# Patient Record
Sex: Male | Born: 1970 | Race: White | Hispanic: No | Smoking: Never smoker
Health system: Southern US, Community
[De-identification: ages and names within clinical notes are randomized; demographics above are authoritative.]

## PROBLEM LIST (undated history)

## (undated) DIAGNOSIS — F1910 Other psychoactive substance abuse, uncomplicated: Secondary | ICD-10-CM

## (undated) DIAGNOSIS — F1420 Cocaine dependence, uncomplicated: Secondary | ICD-10-CM

## (undated) DIAGNOSIS — I4891 Unspecified atrial fibrillation: Secondary | ICD-10-CM

## (undated) HISTORY — DX: Other psychoactive substance abuse, uncomplicated: F19.10

## (undated) HISTORY — DX: Unspecified atrial fibrillation: I48.91

---

## 2003-02-04 ENCOUNTER — Emergency Department (HOSPITAL_COMMUNITY): Admission: EM | Admit: 2003-02-04 | Discharge: 2003-02-04 | Payer: Self-pay | Admitting: Emergency Medicine

## 2003-02-25 ENCOUNTER — Ambulatory Visit (HOSPITAL_COMMUNITY): Admission: RE | Admit: 2003-02-25 | Discharge: 2003-02-25 | Payer: Self-pay | Admitting: *Deleted

## 2004-11-18 ENCOUNTER — Emergency Department (HOSPITAL_COMMUNITY): Admission: EM | Admit: 2004-11-18 | Discharge: 2004-11-18 | Payer: Self-pay | Admitting: Emergency Medicine

## 2008-06-18 ENCOUNTER — Emergency Department (HOSPITAL_COMMUNITY): Admission: EM | Admit: 2008-06-18 | Discharge: 2008-06-18 | Payer: Self-pay

## 2010-07-21 NOTE — Cardiovascular Report (Signed)
NAMEAYLAN, BAYONA NO.:  [id number]   MEDICAL RECORD NO.:  [id number]                   PATIENT TYPE:  OIB   LOCATION:  2861                                 FACILITY:  [facility]   PHYSICIAN:  Carole Binning, M.D. [facility]         DATE OF BIRTH:  Jul 07, 1970   DATE OF PROCEDURE:  02/25/2003  DATE OF DISCHARGE:  02/25/2003                              CARDIAC CATHETERIZATION   PROCEDURES PERFORMED:  1. Left heart catheterization with,  2. Coronary angiography; and,  3. Left ventriculography.   CARDIOLOGIST:  Carole Binning, M.D.   INDICATIONS:  Mr. Dagostino is a 40 year old male who presented with chest  pain and left arm numbness.  A stress Cardiolite performed in the office was  interpreted as revealing inferior ischemia with an ejection fraction of 40%.  The patient was therefore referred for cardiac catheterization to rule out  coronary artery disease.   PROCEDURAL NOTE:  A 6 French sheath was placed  the  right femoral artery.  Left coronary angiography was performed with a 6 French JL-4 catheter.  Right coronary angiography was performed initially with a 6 [place] JR-4  catheter and then we switched out for a No-Torque right catheter.  Eft  ventriculography was performed with an angled pigtail catheter.   Contrast was Omnipaque.   COMPLICATIONS:  There were no complications.   RESULTS:   HEMODYNAMIC DATA:  Left ventricular pressure 102/9.  Aortic pressure 100/64.  There is no aortic valve gradient.   VENTRICULOGRAPHIC DATA:  Left Ventriculogram:  Wall motion is normal.  Ejection fraction calculated at 64%.  There is no mitral regurgitation.   ANGIOGRAPHIC DATA:  Coronary Arteriography (Right Dominant)  Left Main:  The left main is normal.   Left Anterior Descending:  The left anterior descending artery gives rise to  a small first diagonal and a normal-size second and third diagonal branches.  The LAD is normal.   Circumflex Artery:   The left circumflex gives rise to a single large obtuse  marginal branch.  The left circumflex is normal.   Right Coronary Artery:  The right coronary artery is a dominant vessel.  It  gives rise to a normal size posterior descending artery, a small first  posterolateral branch and normal-size second posterolateral branch.   IMPRESSION:  1. Normal left ventricular systolic function.  2. No evidence of coronary artery disease.   CONCLUSION:  In conclusion the patient's Cardiolite scan appears to be a  false positive.  This was a normal study.                                               Carole Binning, M.D. [facility]    MWP/MEDQ  D:  02/25/2003  T:  02/25/2003  Job:  [id number]   cc:  Learta Codding, M.D.

## 2011-01-21 ENCOUNTER — Emergency Department (HOSPITAL_COMMUNITY)
Admission: EM | Admit: 2011-01-21 | Discharge: 2011-01-22 | Disposition: A | Discharge status: Home / Self Care | Payer: Worker's Compensation | Attending: Emergency Medicine | Admitting: Emergency Medicine

## 2011-01-21 DIAGNOSIS — M5380 Other specified dorsopathies, site unspecified: Secondary | ICD-10-CM | POA: Insufficient documentation

## 2011-01-21 DIAGNOSIS — M549 Dorsalgia, unspecified: Secondary | ICD-10-CM | POA: Insufficient documentation

## 2011-01-21 DIAGNOSIS — X500XXA Overexertion from strenuous movement or load, initial encounter: Secondary | ICD-10-CM | POA: Insufficient documentation

## 2011-01-21 DIAGNOSIS — M62830 Muscle spasm of back: Secondary | ICD-10-CM

## 2011-01-21 DIAGNOSIS — Y9269 Other specified industrial and construction area as the place of occurrence of the external cause: Secondary | ICD-10-CM | POA: Insufficient documentation

## 2011-01-21 DIAGNOSIS — M25519 Pain in unspecified shoulder: Secondary | ICD-10-CM | POA: Insufficient documentation

## 2011-01-21 DIAGNOSIS — Y990 Civilian activity done for income or pay: Secondary | ICD-10-CM | POA: Insufficient documentation

## 2011-01-21 HISTORY — DX: Cocaine dependence, uncomplicated: F14.20

## 2011-01-21 LAB — RAPID URINE DRUG SCREEN, HOSP PERFORMED
Amphetamines: NOT DETECTED
Barbiturates: NOT DETECTED
Benzodiazepines: NOT DETECTED
Cocaine: NOT DETECTED
Opiates: NOT DETECTED
Tetrahydrocannabinol: NOT DETECTED

## 2011-01-21 MED ORDER — SODIUM CHLORIDE 0.9 % IV BOLUS (SEPSIS)
250.0000 mL | Freq: Once | INTRAVENOUS | Status: AC
  Administered 2011-01-21: 250 mL via INTRAVENOUS

## 2011-01-21 MED ORDER — KETOROLAC TROMETHAMINE 30 MG/ML IJ SOLN
30.0000 mg | Freq: Once | INTRAMUSCULAR | Status: AC
  Administered 2011-01-21: 30 mg via INTRAVENOUS
  Filled 2011-01-21: qty 1

## 2011-01-21 MED ORDER — DIAZEPAM 5 MG/ML IJ SOLN
5.0000 mg | Freq: Once | INTRAMUSCULAR | Status: AC
Start: 2011-01-21 — End: 2011-01-21
  Administered 2011-01-21: 5 mg via INTRAVENOUS
  Filled 2011-01-21: qty 2

## 2011-01-21 NOTE — ED Notes (Signed)
ZOX:[id number]<AV> Expected date:01/21/11<BR> Expected time: 8:04 PM<BR> Means of arrival:Ambulance<BR> Comments:<BR> [id number] Back pain, injury at work.

## 2011-01-21 NOTE — ED Provider Notes (Signed)
Medical screening examination/treatment/procedure(s) were performed by non-physician practitioner and as supervising physician I was immediately available for consultation/collaboration.   Benny Lennert, MD 01/21/11 2220

## 2011-01-21 NOTE — ED Notes (Signed)
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

## 2011-01-21 NOTE — ED Provider Notes (Addendum)
History     CSN: [id number] Arrival date & time: 01/21/2011  8:23 PM   First MD Initiated Contact with Patient 01/21/11 2100      Chief Complaint  Patient presents with  . 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   Patient is a 41 y.o. male presenting with back pain. The history is provided by the patient.  Back Pain  This is a new problem. The current episode started 6 to 12 hours ago. The problem occurs constantly. The problem has been gradually worsening. The pain is associated with lifting heavy objects. The pain is present in the lumbar spine. The quality of the pain is described as cramping. The pain does not radiate. The pain is at a severity of 10/10. The pain is severe. The symptoms are aggravated by bending, twisting and certain positions. Pertinent negatives include no numbness, no abdominal pain, no bowel incontinence, no perianal numbness, no bladder incontinence, no paresthesias and no weakness. He has tried nothing for the symptoms.    Past Medical History  Diagnosis Date  . Cocaine addiction     History reviewed. No pertinent past surgical history.  No family history on file.  History  Substance Use Topics  . Smoking status: Not on file  . Smokeless tobacco: Not on file  . Alcohol Use:       Review of Systems  Constitutional: Positive for activity change. Negative for chills.  HENT: Negative.   Eyes: Negative.   Respiratory: Negative.   Cardiovascular: Negative.   Gastrointestinal: Negative.  Negative for abdominal pain and bowel incontinence.  Genitourinary: Negative.  Negative for  bladder incontinence.  Musculoskeletal: Positive for back pain.  Neurological: Negative for weakness, numbness and paresthesias.  Hematological: Negative.   Psychiatric/Behavioral: Negative.     Allergies  Review of patient's allergies indicates no known allergies.  Home Medications   Current Outpatient Rx  Name Route Sig Dispense Refill  . IBUPROFEN 200 MG PO TABS Oral Take 800 mg by mouth every 6 (six) hours as needed. Pain     . DIAZEPAM 5 MG PO TABS Oral Take 1 tablet (5 mg total) by mouth 2 (two) times daily. 10 tablet 0  . NAPROXEN 500 MG PO TABS Oral Take 1 tablet (500 mg total) by mouth 2 (two) times daily. 30 tablet 0    BP 114/60  Pulse 60  Temp(Src) 98.2 F (36.8 C) (Oral)  Resp 16  SpO2 100%  Physical Exam  Constitutional: He appears well-developed and well-nourished. No distress.  HENT:  Head: Atraumatic.  Eyes: EOM are normal.  Neck: Neck supple.  Cardiovascular: Regular rhythm.   Pulmonary/Chest: Breath sounds normal.  Abdominal: Soft.  Musculoskeletal:       Right shoulder: He exhibits tenderness and pain.       Arms:   ED Course  Procedures (including critical care time)   Labs Reviewed  URINE RAPID DRUG SCREEN (HOSP PERFORMED)   No results found. 1:06 AM patient states he does not feel ready to go home yet will give additional Valium and reassess in 1 hour   1. Back muscle spasm  MDM  Muscle spasm verses slipped disc due to the history most likely muscle spasm will treat as such and recommend follow up with ortho if not improving         Arman Filter, NP 01/21/11 2122  Arman Filter, NP 01/22/11 0020  Arman Filter, NP 01/22/11 0107

## 2011-01-22 MED ORDER — DIAZEPAM 5 MG/ML IJ SOLN
5.0000 mg | Freq: Once | INTRAMUSCULAR | Status: AC
  Administered 2011-01-22: 5 mg via INTRAVENOUS
  Filled 2011-01-22: qty 2

## 2011-01-22 MED ORDER — DIAZEPAM 5 MG PO TABS: 5.0000 mg | ORAL_TABLET | Freq: Two times a day (BID) | ORAL | Status: AC

## 2011-01-22 MED ORDER — NAPROXEN 500 MG PO TABS: 500.0000 mg | ORAL_TABLET | Freq: Two times a day (BID) | ORAL | Status: AC

## 2011-01-22 NOTE — ED Notes (Addendum)
Pt was unable to ambulate without assistance. Pt was unable to stand up straight, however he was able to ambulate bent slightly forward. Pt complained of pain at 2/10. After arriving to room pt began to complain of "back locking up". Pt was unable to stand without assistance. Pt was helped to bed. NP Kathrine Cords Made aware.

## 2011-01-22 NOTE — ED Provider Notes (Signed)
Medical screening examination/treatment/procedure(s) were performed by non-physician practitioner and as supervising physician I was immediately available for consultation/collaboration.   Benny Lennert, MD 01/22/11 1539

## 2012-08-21 ENCOUNTER — Emergency Department (HOSPITAL_COMMUNITY)
Admission: EM | Admit: 2012-08-21 | Discharge: 2012-08-21 | Disposition: A | Discharge status: Home / Self Care | Payer: 59 | Source: Home / Self Care | Attending: Family Medicine | Admitting: Family Medicine

## 2012-08-21 ENCOUNTER — Encounter (HOSPITAL_COMMUNITY): Payer: Self-pay | Admitting: Emergency Medicine

## 2012-08-21 DIAGNOSIS — J40 Bronchitis, not specified as acute or chronic: Secondary | ICD-10-CM

## 2012-08-21 DIAGNOSIS — J040 Acute laryngitis: Secondary | ICD-10-CM

## 2012-08-21 LAB — POCT RAPID STREP A: Streptococcus, Group A Screen (Direct): NEGATIVE

## 2012-08-21 MED ORDER — GUAIFENESIN-CODEINE 100-10 MG/5ML PO SYRP: 5.0000 mL | ORAL_SOLUTION | Freq: Three times a day (TID) | ORAL | Status: DC | PRN

## 2012-08-21 MED ORDER — AZITHROMYCIN 250 MG PO TABS: ORAL_TABLET | ORAL | Status: DC

## 2012-08-21 MED ORDER — BENZONATATE 100 MG PO CAPS: 100.0000 mg | ORAL_CAPSULE | Freq: Three times a day (TID) | ORAL | Status: DC

## 2012-08-21 MED ORDER — PREDNISONE 20 MG PO TABS: ORAL_TABLET | ORAL | Status: DC

## 2012-08-21 MED ORDER — CETIRIZINE-PSEUDOEPHEDRINE ER 5-120 MG PO TB12
1.0000 | ORAL_TABLET | Freq: Two times a day (BID) | ORAL | Status: DC | PRN
Start: 2012-08-21 — End: 2013-12-30

## 2012-08-21 MED ORDER — IBUPROFEN 600 MG PO TABS: 600.0000 mg | ORAL_TABLET | Freq: Three times a day (TID) | ORAL | Status: DC | PRN

## 2012-08-21 MED ORDER — ALBUTEROL SULFATE HFA 108 (90 BASE) MCG/ACT IN AERS: 1.0000 | INHALATION_SPRAY | Freq: Four times a day (QID) | RESPIRATORY_TRACT | Status: DC | PRN

## 2012-08-21 NOTE — ED Notes (Signed)
Pt c/o sore throat x 3-4 weeks. Has lots of chest congestion and has been coughing up mucous. Has been taking Mucinex with mild relief. Pt is losing his voice. Feels like he is progressively getting worse. Patient is alert and oriented.

## 2012-08-21 NOTE — ED Provider Notes (Signed)
History     CSN: [id number]  Arrival date & time 08/21/12  1341   First MD Initiated Contact with Patient 08/21/12 1411      Chief Complaint  Patient presents with  . Sore Throat    (Consider location/radiation/quality/duration/timing/severity/associated sxs/prior treatment) HPI Comments: 42 y/o nonsmoker male. Here complaining of sore throat, nasal congestion, cough and episodes of persistent frequent coughing and intermittent wheezing for about 3 weeks. Denies fever. Reports yellow sputum. Appetite is good. Energy level is good. Has been taking Mucinex with some improvement of his symptoms. In the last 2 days patient presents with hoarseness associated symptoms. Feels his symptoms are getting worse. Denies current pain with deep inspiration. Patient works as a Interior and spatial designer.   Past Medical History  Diagnosis Date  . Cocaine addiction     History reviewed. No pertinent past surgical history.  History reviewed. No pertinent family history.  History  Substance Use Topics  . Smoking status: Never Smoker   . Smokeless tobacco: Not on file  . Alcohol Use: No      Review of Systems  Constitutional: Negative for fever and chills.  HENT: Positive for congestion, sore throat, rhinorrhea and voice change.   Respiratory: Positive for cough and wheezing. Negative for shortness of breath.   Cardiovascular: Negative for chest pain, palpitations and leg swelling.  Gastrointestinal: Negative for nausea, vomiting and abdominal pain.  Musculoskeletal: Negative for myalgias and arthralgias.  Neurological: Negative for dizziness and headaches.  All other systems reviewed and are negative.    Allergies  Review of patient's allergies indicates no known allergies.  Home Medications   Current Outpatient Rx  Name  Route  Sig  Dispense  Refill  . albuterol (PROVENTIL HFA;VENTOLIN HFA) 108 (90 BASE) MCG/ACT inhaler   Inhalation   Inhale 1-2 puffs into the lungs every 6 (six) hours as  needed for wheezing.   1 Inhaler   0   . azithromycin (ZITHROMAX) 250 MG tablet      2 tabs by mouth on day one then one tablet daily for 4 more days.   6 tablet   0   . benzonatate (TESSALON) 100 MG capsule   Oral   Take 1 capsule (100 mg total) by mouth every 8 (eight) hours.   21 capsule   0   . cetirizine-pseudoephedrine (ZYRTEC-D) 5-120 MG per tablet   Oral   Take 1 tablet by mouth 2 (two) times daily as needed for allergies or rhinitis.   30 tablet   0   . guaiFENesin-codeine (ROBITUSSIN AC) 100-10 MG/5ML syrup   Oral   Take 5 mLs by mouth 3 (three) times daily as needed for cough.   120 mL   0   . ibuprofen (ADVIL,MOTRIN) 600 MG tablet   Oral   Take 1 tablet (600 mg total) by mouth every 8 (eight) hours as needed for pain or fever (take with food).   21 tablet   0   . predniSONE (DELTASONE) 20 MG tablet      2 tabs po daily for 5 days   10 tablet   0     BP 122/74  Pulse 67  Temp(Src) 98.4 F (36.9 C) (Oral)  Resp 18  SpO2 99%  Physical Exam  Nursing note and vitals reviewed. Constitutional: He is oriented to person, place, and time. He appears well-developed and well-nourished. No distress.  HENT:  Head: Normocephalic and atraumatic.  Nasal Congestion with erythema and swelling of nasal turbinates, clear  rhinorrhea. Hoarse voice. Pharyngeal erythema no exudates. No uvula deviation. No trismus. TM's with increased vascular markings but no dullness, swelling or bulging.  Eyes: Conjunctivae are normal. Right eye exhibits no discharge. Left eye exhibits no discharge. No scleral icterus.  Cardiovascular: Normal rate, regular rhythm and normal heart sounds.   Pulmonary/Chest: Effort normal and breath sounds normal. No respiratory distress. He has no wheezes. He has no rales. He exhibits no tenderness.  Bronchitic cough.  Lymphadenopathy:    He has no cervical adenopathy.  Neurological: He is alert and oriented to person, place, and time.  Skin: No  rash noted. He is not diaphoretic.    ED Course  Procedures (including critical care time)  Labs Reviewed  POCT RAPID STREP A ([facility]RG CARE ONLY)   No results found.   1. Bronchitis   2. Laryngitis       MDM  Treated with ibuprofen, prednisone, albuterol, azithromycin, Tessalon Perles, cetirizine/pseudoephedrine and guaifenesin/codeine. Supportive care and red flags that should prompt his return to medical attention discussed with patient and provided in writing.        Sharin Grave, MD 08/22/12 [phone]

## 2012-08-23 LAB — CULTURE, GROUP A STREP

## 2013-06-30 ENCOUNTER — Encounter (HOSPITAL_COMMUNITY): Payer: Self-pay | Admitting: Emergency Medicine

## 2013-06-30 ENCOUNTER — Emergency Department (HOSPITAL_COMMUNITY)
Admission: EM | Admit: 2013-06-30 | Discharge: 2013-06-30 | Disposition: A | Discharge status: Home / Self Care | Payer: 59 | Source: Home / Self Care

## 2013-06-30 DIAGNOSIS — S01501A Unspecified open wound of lip, initial encounter: Secondary | ICD-10-CM

## 2013-06-30 DIAGNOSIS — S01511A Laceration without foreign body of lip, initial encounter: Secondary | ICD-10-CM

## 2013-06-30 DIAGNOSIS — S01551A Open bite of lip, initial encounter: Secondary | ICD-10-CM

## 2013-06-30 DIAGNOSIS — W540XXA Bitten by dog, initial encounter: Secondary | ICD-10-CM

## 2013-06-30 MED ORDER — AMOXICILLIN-POT CLAVULANATE 875-125 MG PO TABS: 1.0000 | ORAL_TABLET | Freq: Two times a day (BID) | ORAL | Status: DC

## 2013-06-30 MED ORDER — BACITRACIN 500 UNIT/GM EX OINT
1.0000 "application " | TOPICAL_OINTMENT | Freq: Once | CUTANEOUS | Status: AC
  Administered 2013-06-30: 1 via TOPICAL

## 2013-06-30 NOTE — Discharge Instructions (Signed)
Animal Bite °An animal bite can result in a scratch on the skin, deep open cut, puncture of the skin, crush injury, or tearing away of the skin or a body part. Dogs are responsible for most animal bites. Children are bitten more often than adults. An animal bite can range from very mild to more serious. A small bite from your house pet is no cause for alarm. However, some animal bites can become infected or injure a bone or other tissue. You must seek medical care if: °· The skin is broken and bleeding does not slow down or stop after 15 minutes. °· The puncture is deep and difficult to clean (such as a cat bite). °· Pain, warmth, redness, or pus develops around the wound. °· The bite is from a stray animal or rodent. There may be a risk of rabies infection. °· The bite is from a snake, raccoon, skunk, fox, coyote, or bat. There may be a risk of rabies infection. °· The person bitten has a chronic illness such as diabetes, liver disease, or cancer, or the person takes medicine that lowers the immune system. °· There is concern about the location and severity of the bite. °It is important to clean and protect an animal bite wound right away to prevent infection. Follow these steps: °· Clean the wound with plenty of water and soap. °· Apply an antibiotic cream. °· Apply gentle pressure over the wound with a clean towel or gauze to slow or stop bleeding. °· Elevate the affected area above the heart to help stop any bleeding. °· Seek medical care. Getting medical care within 8 hours of the animal bite leads to the best possible outcome. °DIAGNOSIS  °Your caregiver will most likely: °· Take a detailed history of the animal and the bite injury. °· Perform a wound exam. °· Take your medical history. °Blood tests or X-rays may be performed. Sometimes, infected bite wounds are cultured and sent to a lab to identify the infectious bacteria.  °TREATMENT  °Medical treatment will depend on the location and type of animal bite as  well as the patient's medical history. Treatment may include: °· Wound care, such as cleaning and flushing the wound with saline solution, bandaging, and elevating the affected area. °· Antibiotics. °· Tetanus immunization. °· Rabies immunization. °· Leaving the wound open to heal. This is often done with animal bites, due to the high risk of infection. However, in certain cases, wound closure with stitches, wound adhesive, skin adhesive strips, or staples may be used. ° Infected bites that are left untreated may require intravenous (IV) antibiotics and surgical treatment in the hospital. °HOME CARE INSTRUCTIONS °· Follow your caregiver's instructions for wound care. °· Take all medicines as directed. °· If your caregiver prescribes antibiotics, take them as directed. Finish them even if you start to feel better. °· Follow up with your caregiver for further exams or immunizations as directed. °You may need a tetanus shot if: °· You cannot remember when you had your last tetanus shot. °· You have never had a tetanus shot. °· The injury broke your skin. °If you get a tetanus shot, your arm may swell, get red, and feel warm to the touch. This is common and not a problem. If you need a tetanus shot and you choose not to have one, there is a rare chance of getting tetanus. Sickness from tetanus can be serious. °SEEK MEDICAL CARE IF: °· You notice warmth, redness, soreness, swelling, pus discharge, or a bad   smell coming from the wound.  You have a red line on the skin coming from the wound.  You have a fever, chills, or a general ill feeling.  You have nausea or vomiting.  You have continued or worsening pain.  You have trouble moving the injured part.  You have other questions or concerns. MAKE SURE YOU:  Understand these instructions.  Will watch your condition.  Will get help right away if you are not doing well or get worse. Document Released: 11/07/2010 Document Revised: 05/14/2011 Document  Reviewed: 11/07/2010 [facility] Patient Information 2014 [place].  Facial Laceration  A facial laceration is a cut on the face. These injuries can be painful and cause bleeding. Lacerations usually heal quickly, but they need special care to reduce scarring. DIAGNOSIS  Your health care provider will take a medical history, ask for details about how the injury occurred, and examine the wound to determine how deep the cut is. TREATMENT  Some facial lacerations may not require closure. Others may not be able to be closed because of an increased risk of infection. The risk of infection and the chance for successful closure will depend on various factors, including the amount of time since the injury occurred. The wound may be cleaned to help prevent infection. If closure is appropriate, pain medicines may be given if needed. Your health care provider will use stitches (sutures), wound glue (adhesive), or skin adhesive strips to repair the laceration. These tools bring the skin edges together to allow for faster healing and a better cosmetic outcome. If needed, you may also be given a tetanus shot. HOME CARE INSTRUCTIONS  Only take over-the-counter or prescription medicines as directed by your health care provider.  Follow your health care provider's instructions for wound care. These instructions will vary depending on the technique used for closing the wound. For Sutures:  Keep the wound clean and dry.   If you were given a bandage (dressing), you should change it at least once a day. Also change the dressing if it becomes wet or dirty, or as directed by your health care provider.   Wash the wound with soap and water 2 times a day. Rinse the wound off with water to remove all soap. Pat the wound dry with a clean towel.   After cleaning, apply a thin layer of the antibiotic ointment recommended by your health care provider. This will help prevent infection and keep the dressing from  sticking.   You may shower as usual after the first 24 hours. Do not soak the wound in water until the sutures are removed.   Get your sutures removed as directed by your health care provider. With facial lacerations, sutures should usually be taken out after 4 5 days to avoid stitch marks.   Wait a few days after your sutures are removed before applying any makeup. For Skin Adhesive Strips:  Keep the wound clean and dry.   Do not get the skin adhesive strips wet. You may bathe carefully, using caution to keep the wound dry.   If the wound gets wet, pat it dry with a clean towel.   Skin adhesive strips will fall off on their own. You may trim the strips as the wound heals. Do not remove skin adhesive strips that are still stuck to the wound. They will fall off in time.  For Wound Adhesive:  You may briefly wet your wound in the shower or bath. Do not soak or scrub the wound.  Do not swim. Avoid periods of heavy sweating until the skin adhesive has fallen off on its own. After showering or bathing, gently pat the wound dry with a clean towel.   Do not apply liquid medicine, cream medicine, ointment medicine, or makeup to your wound while the skin adhesive is in place. This may loosen the film before your wound is healed.   If a dressing is placed over the wound, be careful not to apply tape directly over the skin adhesive. This may cause the adhesive to be pulled off before the wound is healed.   Avoid prolonged exposure to sunlight or tanning lamps while the skin adhesive is in place.  The skin adhesive will usually remain in place for 5 10 days, then naturally fall off the skin. Do not pick at the adhesive film.  After Healing: Once the wound has healed, cover the wound with sunscreen during the day for 1 full year. This can help minimize scarring. Exposure to ultraviolet light in the first year will darken the scar. It can take 1 2 years for the scar to lose its redness and to  heal completely.  SEEK IMMEDIATE MEDICAL CARE IF:  You have redness, pain, or swelling around the wound.   You see ayellowish-white fluid (pus) coming from the wound.   You have chills or a fever.  MAKE SURE YOU:  Understand these instructions.  Will watch your condition.  Will get help right away if you are not doing well or get worse. Document Released: 03/29/2004 Document Revised: 12/10/2012 Document Reviewed: 10/02/2012 [facility] Patient Information 2014 [place], [place].  Laceration Care, Adult A laceration is a cut or lesion that goes through all layers of the skin and into the tissue just beneath the skin. TREATMENT  Some lacerations may not require closure. Some lacerations may not be able to be closed due to an increased risk of infection. It is important to see your caregiver as soon as possible after an injury to minimize the risk of infection and maximize the opportunity for successful closure. If closure is appropriate, pain medicines may be given, if needed. The wound will be cleaned to help prevent infection. Your caregiver will use stitches (sutures), staples, wound glue (adhesive), or skin adhesive strips to repair the laceration. These tools bring the skin edges together to allow for faster healing and a better cosmetic outcome. However, all wounds will heal with a scar. Once the wound has healed, scarring can be minimized by covering the wound with sunscreen during the day for 1 full year. HOME CARE INSTRUCTIONS  For sutures or staples:  Keep the wound clean and dry.  If you were given a bandage (dressing), you should change it at least once a day. Also, change the dressing if it becomes wet or dirty, or as directed by your caregiver.  Wash the wound with soap and water 2 times a day. Rinse the wound off with water to remove all soap. Pat the wound dry with a clean towel.  After cleaning, apply a thin layer of the antibiotic ointment as recommended by your  caregiver. This will help prevent infection and keep the dressing from sticking.  You may shower as usual after the first 24 hours. Do not soak the wound in water until the sutures are removed.  Only take over-the-counter or prescription medicines for pain, discomfort, or fever as directed by your caregiver.  Get your sutures or staples removed as directed by your caregiver. For skin adhesive strips:  Keep the wound clean and dry.  Do not get the skin adhesive strips wet. You may bathe carefully, using caution to keep the wound dry.  If the wound gets wet, pat it dry with a clean towel.  Skin adhesive strips will fall off on their own. You may trim the strips as the wound heals. Do not remove skin adhesive strips that are still stuck to the wound. They will fall off in time. For wound adhesive:  You may briefly wet your wound in the shower or bath. Do not soak or scrub the wound. Do not swim. Avoid periods of heavy perspiration until the skin adhesive has fallen off on its own. After showering or bathing, gently pat the wound dry with a clean towel.  Do not apply liquid medicine, cream medicine, or ointment medicine to your wound while the skin adhesive is in place. This may loosen the film before your wound is healed.  If a dressing is placed over the wound, be careful not to apply tape directly over the skin adhesive. This may cause the adhesive to be pulled off before the wound is healed.  Avoid prolonged exposure to sunlight or tanning lamps while the skin adhesive is in place. Exposure to ultraviolet light in the first year will darken the scar.  The skin adhesive will usually remain in place for 5 to 10 days, then naturally fall off the skin. Do not pick at the adhesive film. You may need a tetanus shot if:  You cannot remember when you had your last tetanus shot.  You have never had a tetanus shot. If you get a tetanus shot, your arm may swell, get red, and feel warm to the  touch. This is common and not a problem. If you need a tetanus shot and you choose not to have one, there is a rare chance of getting tetanus. Sickness from tetanus can be serious. SEEK MEDICAL CARE IF:   You have redness, swelling, or increasing pain in the wound.  You see a red line that goes away from the wound.  You have yellowish-white fluid (pus) coming from the wound.  You have a fever.  You notice a bad smell coming from the wound or dressing.  Your wound breaks open before or after sutures have been removed.  You notice something coming out of the wound such as wood or glass.  Your wound is on your hand or foot and you cannot move a finger or toe. SEEK IMMEDIATE MEDICAL CARE IF:   Your pain is not controlled with prescribed medicine.  You have severe swelling around the wound causing pain and numbness or a change in color in your arm, hand, leg, or foot.  Your wound splits open and starts bleeding.  You have worsening numbness, weakness, or loss of function of any joint around or beyond the wound.  You develop painful lumps near the wound or on the skin anywhere on your body. MAKE SURE YOU:   Understand these instructions.  Will watch your condition.  Will get help right away if you are not doing well or get worse. Document Released: 02/19/2005 Document Revised: 05/14/2011 Document Reviewed: 08/15/2010 [facility] Patient Information 2014 [place], [place].  Pasteurella Multocida Infection Pasteurella multocida or P. multocida is a bacteria that can cause infection. Healthy dogs and cats carry these bacteria in their mouths. This kind of infection is usually caused by an animal bite. It can also occur after a dog or cat licks a person's  skin that is damaged by a cut or scratch. When people are infected, a bad skin infection usually results. The infection can then spread into bones and tendons. Rarely, the infection can spread to your blood. If this happens, you can develop  a heart infection (endocarditis). The bacteria can also cause an infection on the surface of the brain (meningitis). CAUSES  Contact with an animal is usually the cause of this infection. Cats, dogs, poultry (chicken, [place]urkey), and livestock (cow, horse, sheep) can all carry the bacteria. The bacteria may spread to a person through biting, scratching, or licking an open sore. Sometimes, the cause is unknown. SYMPTOMS  Symptoms usually start within 24 hours after contact with an animal. Symptoms may include:  Pain, redness, warmth, and swelling around the bite.  Fluid leaking from the bite area.  Fever.  Joint pain. This can make it hard for you to move.  Bone pain. DIAGNOSIS  To decide if you have a P. multocida infection, your caregiver will probably:  Ask about any recent contact you have had with animals.  Check for signs of infection. This could include:  Taking a sample of fluid leaking from your wound.  Taking a sample of fluid from a joint.  Doing blood tests.  Doing imaging tests. This may include CT scans or an MRI scan. These scans can show if there is an infection in your tendons, joints, or bones. TREATMENT   For a simple skin and soft tissue infection, you may need to take antibiotic medicines for 7 to 10 days. For a worse infection, antibiotics may need to be given for 2 to 6 weeks.  Some infections need to be treated in the hospital. You will be given antibiotics through an intravenous line (IV). A needle will be put in your hand or arm. The medicine will flow directly into your body through the IV. Initial hospital care is often needed for deep wounds or infections that have spread to the bone, joint, or blood. HOME CARE INSTRUCTIONS  Take your antibiotics as directed. Finish them even if you start to feel better.  Rest at home until your caregiver says it is okay to go back to your normal activities.  Keep all follow-up appointments as directed. This is how  your caregiver can make sure your treatment is working. You may need a tetanus shot if:  You cannot remember when you had your last tetanus shot.  You have never had a tetanus shot.  The injury broke your skin. If you get a tetanus shot, your arm may swell, get red, and feel warm to the touch. This is common and not a problem. If you need a tetanus shot and you choose not to have one, there is a rare chance of getting tetanus. Sickness from tetanus can be serious. SEEK IMMEDIATE MEDICAL CARE IF:  Your pain from the wound gets worse.  You develop redness, warmth, or swelling around the wound.  You see fluid or pus leaking from the wound.  You have trouble moving the infected area or develop swelling of a joint.  You develop a bad headache or a stiff neck.  You have chest pain.  You have trouble breathing.  You have a fever.  You develop side effects from your medicines. MAKE SURE YOU:  Understand these instructions.  Will watch your condition.  Will get help right away if you are not doing well or get worse. Document Released: 11/07/2010 Document Revised: 05/14/2011 Document Reviewed: 11/07/2010 [employer]  Patient Information 2014 [place].  Stitches, Staples, or Skin Adhesive Strips  Stitches (sutures), staples, and skin adhesive strips hold the skin together as it heals. They will usually be in place for 7 days or less. HOME CARE  Wash your hands with soap and water before and after you touch your wound.  Only take medicine as told by your doctor.  Cover your wound only if your doctor told you to. Otherwise, leave it open to air.  Do not get your stitches wet or dirty. If they get dirty, dab them gently with a clean washcloth. Wet the washcloth with soapy water. Do not rub. Pat them dry gently.  Do not put medicine or medicated cream on your stitches unless your doctor told you to.  Do not take out your own stitches or staples. Skin adhesive strips will fall  off by themselves.  Do not pick at the wound. Picking can cause an infection.  Do not miss your follow-up appointment.  If you have problems or questions, call your doctor. GET HELP RIGHT AWAY IF:   You have a temperature by mouth above 102 F (38.9 C), not controlled by medicine.  You have chills.  You have redness or pain around your stitches.  There is puffiness (swelling) around your stitches.  You notice fluid (drainage) from your stitches.  There is a bad smell coming from your wound. MAKE SURE YOU:  Understand these instructions.  Will watch your condition.  Will get help if you are not doing well or get worse. Document Released: 12/17/2008 Document Revised: 05/14/2011 Document Reviewed: 12/17/2008 [facility] Patient Information 2014 [place], [place].

## 2013-06-30 NOTE — ED Notes (Signed)
C/o dog bite this morning States his dog was stuck under a fence crying when he tried to get the dog out the dog bite him on his mouth States dog did not mean to hurt him The patient is the Research scientist (life sciences)dog owner

## 2013-06-30 NOTE — ED Provider Notes (Signed)
CSN: [id number]     Arrival date & time 06/30/13  [id number] History   First MD Initiated Contact with Patient 06/30/13 [phone]     Chief Complaint  Patient presents with  . Animal Bite   (Consider location/radiation/quality/duration/timing/severity/associated sxs/prior Treatment) HPI Comments: Attempting to free his dog from a trapped position and was bit in the face. This produced lacerations to the lower lip and superficial abrasins to right side of his face.  Tdap 7 y ago Dog is UTD on vaccinations.   Past Medical History  Diagnosis Date  . Cocaine addiction    History reviewed. No pertinent past surgical history. History reviewed. No pertinent family history. History  Substance Use Topics  . Smoking status: Never Smoker   . Smokeless tobacco: Not on file  . Alcohol Use: No    Review of Systems  Constitutional: Negative.   HENT: Negative.   Respiratory: Negative.   Skin: Positive for wound.  Neurological: Negative for dizziness, tremors, facial asymmetry and speech difficulty.    Allergies  Review of patient's allergies indicates no known allergies.  Home Medications   Prior to Admission medications   Medication Sig Start Date End Date Taking? Authorizing Provider  albuterol (PROVENTIL HFA;VENTOLIN HFA) 108 (90 BASE) MCG/ACT inhaler Inhale 1-2 puffs into the lungs every 6 (six) hours as needed for wheezing. 08/21/12   Adlih Moreno-Coll, MD  azithromycin (ZITHROMAX) 250 MG tablet 2 tabs by mouth on day one then one tablet daily for 4 more days. 08/21/12   Adlih Moreno-Coll, MD  benzonatate (TESSALON) 100 MG capsule Take 1 capsule (100 mg total) by mouth every 8 (eight) hours. 08/21/12   Adlih Moreno-Coll, MD  cetirizine-pseudoephedrine (ZYRTEC-D) 5-120 MG per tablet Take 1 tablet by mouth 2 (two) times daily as needed for allergies or rhinitis. 08/21/12   Adlih Moreno-Coll, MD  guaiFENesin-codeine (ROBITUSSIN AC) 100-10 MG/5ML syrup Take 5 mLs by mouth 3 (three) times daily as  needed for cough. 08/21/12   Adlih Moreno-Coll, MD  ibuprofen (ADVIL,MOTRIN) 600 MG tablet Take 1 tablet (600 mg total) by mouth every 8 (eight) hours as needed for pain or fever (take with food). 08/21/12   Adlih Moreno-Coll, MD  predniSONE (DELTASONE) 20 MG tablet 2 tabs po daily for 5 days 08/21/12   Adlih Moreno-Coll, MD   BP 134/81  Pulse 53  Temp(Src) 98.3 F (36.8 C) (Oral)  Resp 16  SpO2 100% Physical Exam  Nursing note and vitals reviewed. Constitutional: He is oriented to person, place, and time. He appears well-developed and well-nourished. No distress.  HENT:  Mouth/Throat: Oropharynx is clear and moist. No oropharyngeal exudate.  Neck: Normal range of motion. Neck supple.  Pulmonary/Chest: Effort normal. No respiratory distress.  Neurological: He is alert and oriented to person, place, and time. He exhibits normal muscle tone.  Skin: Skin is warm and dry.  1 cm laceration to right lower lip through the vermilion. Superficial, even, linear edges.  Similar laceration just below the L side of the lip involving the skin but ot the vermilion. Smaller, more superficial abrasions to the R side of the face.   Psychiatric: He has a normal mood and affect.    ED Course  LACERATION REPAIR Date/Time: 06/30/2013 10:50 AM Performed by: Phineas RealMABE, Betsie Peckman Authorized by: Phineas RealMABE, Dawsyn Ramsaran Consent: Verbal consent obtained. Risks and benefits: risks, benefits and alternatives were discussed Consent given by: patient Patient understanding: patient states understanding of the procedure being performed Patient identity confirmed: verbally with patient Body area: head/neck Location  details: lower lip Full thickness lip laceration: no Vermillion border involved: yes Lip laceration height: up to half vertical height Laceration length: 1 cm Foreign bodies: no foreign bodies Tendon involvement: none Nerve involvement: none Vascular damage: no Anesthesia: local infiltration Local anesthetic: lidocaine  2% with epinephrine Anesthetic total: 2.5 ml Preparation: Patient was prepped and draped in the usual sterile fashion. Irrigation solution: saline Irrigation method: jet lavage Amount of cleaning: extensive Debridement: none Degree of undermining: none Skin closure: 6-0 nylon Number of sutures: 2 Technique: simple Approximation: loose Approximation difficulty: simple Lip approximation: vermillion border well aligned Dressing: antibiotic ointment Patient tolerance: Patient tolerated the procedure well with no immediate complications. Comments: This is the right side of the lip laceration, loose, aligned approximation.   2nd Laceration repair Located left side of lip, skin, not involving vermillion 2 cm length Superficial Sterile procedure Lidocaine with epi 2.5 cc.  Copious irrigation with jet lavage of NS. F/B scrub with NS. Closed with 6-0 nylon #3 sutures, simple, interrupted, loose approximation Labs Review Labs Reviewed - No data to display  Imaging Review No results found.   MDM   1. Dog bite of vermilion border of lower lip   2. Dog bite of skin of lip   3. Laceration of skin of lip   4. Laceration of vermilion border of lower lip     Loosly close laceration involving lip border, match vermilion border line Watch for infection SR 5 d   Return sooner for problems. augmentin prophylaxis  Total length of 2 lacerations 3 cm.    Hayden Rasmussenavid Kaloni Bisaillon, NP 06/30/13 1710

## 2013-07-01 NOTE — ED Provider Notes (Signed)
Medical screening examination/treatment/procedure(s) were performed by resident physician or non-physician practitioner and as supervising physician I was immediately available for consultation/collaboration.   KINDL,JAMES DOUGLAS MD.   James D Kindl, MD 07/01/13 1706 

## 2013-07-07 ENCOUNTER — Emergency Department (INDEPENDENT_AMBULATORY_CARE_PROVIDER_SITE_OTHER)
Admission: EM | Admit: 2013-07-07 | Discharge: 2013-07-07 | Disposition: A | Discharge status: Home / Self Care | Payer: 59 | Source: Home / Self Care | Attending: Family Medicine | Admitting: Family Medicine

## 2013-07-07 ENCOUNTER — Encounter (HOSPITAL_COMMUNITY): Payer: Self-pay | Admitting: Emergency Medicine

## 2013-07-07 DIAGNOSIS — W540XXA Bitten by dog, initial encounter: Secondary | ICD-10-CM

## 2013-07-07 DIAGNOSIS — Z4802 Encounter for removal of sutures: Secondary | ICD-10-CM

## 2013-07-07 NOTE — ED Provider Notes (Signed)
Medical screening examination/treatment/procedure(s) were performed by resident physician or non-physician practitioner and as supervising physician I was immediately available for consultation/collaboration.   KINDL,JAMES DOUGLAS MD.   James D Kindl, MD 07/07/13 1131 

## 2013-07-07 NOTE — ED Notes (Signed)
Pt  Is  Here  For  Suture  Removal      Sutures  Have    Been  In  For  About     1  Week      Pt       Is  In no  Acute  Distress         Wound  Appears  To  Be  Healing  Well

## 2013-07-07 NOTE — Discharge Instructions (Signed)
Suture Removal, Care After Refer to this sheet in the next few weeks. These instructions provide you with information on caring for yourself after your procedure. Your health care provider may also give you more specific instructions. Your treatment has been planned according to current medical practices, but problems sometimes occur. Call your health care provider if you have any problems or questions after your procedure. WHAT TO EXPECT AFTER THE PROCEDURE After your stitches (sutures) are removed, it is typical to have the following:  Some discomfort and swelling in the wound area.  Slight redness in the area. HOME CARE INSTRUCTIONS   If you have skin adhesive strips over the wound area, do not take the strips off. They will fall off on their own in a few days. If the strips remain in place after 14 days, you may remove them.  Change any bandages (dressings) at least once a day or as directed by your health care provider. If the bandage sticks, soak it off with warm, soapy water.  Apply cream or ointment only as directed by your health care provider. If using cream or ointment, wash the area with soap and water 2 times a day to remove all the cream or ointment. Rinse off the soap and pat the area dry with a clean towel.  Keep the wound area dry and clean. If the bandage becomes wet or dirty, or if it develops a bad smell, change it as soon as possible.  Continue to protect the wound from injury.  Use sunscreen when out in the sun. New scars become sunburned easily. SEEK MEDICAL CARE IF:  You have increasing redness, swelling, or pain in the wound.  You see pus coming from the wound.  You have a fever.  You notice a bad smell coming from the wound or dressing.  Your wound breaks open (edges not staying together). Document Released: 11/14/2000 Document Revised: 12/10/2012 Document Reviewed: 10/01/2012 [employer] Patient Information 2014 [employer].  

## 2013-07-07 NOTE — ED Provider Notes (Signed)
CSN: [id number]     Arrival date & time 07/07/13  0915 History   First MD Initiated Contact with Patient 07/07/13 1024     Chief Complaint  Patient presents with  . Suture / Staple Removal   (Consider location/radiation/quality/duration/timing/severity/associated sxs/prior Treatment) HPI Comments: 43 year old male presents for suture removal. Had 5 sutures on his face one week ago. No pain or discharge. Healing nicely.  Patient is a 43 y.o. male presenting with suture removal.  Suture / Staple Removal    Past Medical History  Diagnosis Date  . Cocaine addiction    History reviewed. No pertinent past surgical history. History reviewed. No pertinent family history. History  Substance Use Topics  . Smoking status: Never Smoker   . Smokeless tobacco: Not on file  . Alcohol Use: No    Review of Systems  Skin: Positive for wound.  All other systems reviewed and are negative.   Allergies  Review of patient's allergies indicates no known allergies.  Home Medications   Prior to Admission medications   Medication Sig Start Date End Date Taking? Authorizing Provider  albuterol (PROVENTIL HFA;VENTOLIN HFA) 108 (90 BASE) MCG/ACT inhaler Inhale 1-2 puffs into the lungs every 6 (six) hours as needed for wheezing. 08/21/12   Adlih Moreno-Coll, MD  amoxicillin-clavulanate (AUGMENTIN) 875-125 MG per tablet Take 1 tablet by mouth every 12 (twelve) hours. 06/30/13   Hayden Rasmussenavid Mabe, NP  azithromycin (ZITHROMAX) 250 MG tablet 2 tabs by mouth on day one then one tablet daily for 4 more days. 08/21/12   Adlih Moreno-Coll, MD  benzonatate (TESSALON) 100 MG capsule Take 1 capsule (100 mg total) by mouth every 8 (eight) hours. 08/21/12   Adlih Moreno-Coll, MD  cetirizine-pseudoephedrine (ZYRTEC-D) 5-120 MG per tablet Take 1 tablet by mouth 2 (two) times daily as needed for allergies or rhinitis. 08/21/12   Adlih Moreno-Coll, MD  guaiFENesin-codeine (ROBITUSSIN AC) 100-10 MG/5ML syrup Take 5 mLs by mouth 3  (three) times daily as needed for cough. 08/21/12   Adlih Moreno-Coll, MD  ibuprofen (ADVIL,MOTRIN) 600 MG tablet Take 1 tablet (600 mg total) by mouth every 8 (eight) hours as needed for pain or fever (take with food). 08/21/12   Adlih Moreno-Coll, MD  predniSONE (DELTASONE) 20 MG tablet 2 tabs po daily for 5 days 08/21/12   Sharin GraveAdlih Moreno-Coll, MD   There were no vitals taken for this visit. Physical Exam  Nursing note and vitals reviewed. Constitutional: He is oriented to person, place, and time. He appears well-developed and well-nourished. No distress.  HENT:  Head: Normocephalic. Head is with laceration (sutures in place).  Pulmonary/Chest: Effort normal. No respiratory distress.  Neurological: He is alert and oriented to person, place, and time. Coordination normal.  Skin: Skin is warm and dry. No rash noted. He is not diaphoretic.  Psychiatric: He has a normal mood and affect. Judgment normal.    ED Course  Procedures (including critical care time) Labs Review Labs Reviewed - No data to display  Imaging Review No results found.   MDM   1. Visit for suture removal    5 sutures removed, watch for signs of infection, followup when necessary       Graylon GoodZachary H Mayleigh Tetrault, PA-C 07/07/13 1041

## 2013-12-30 ENCOUNTER — Ambulatory Visit (INDEPENDENT_AMBULATORY_CARE_PROVIDER_SITE_OTHER): Payer: 59 | Admitting: Family Medicine

## 2013-12-30 VITALS — BP 122/74 | HR 70 | Temp 98.0°F | Resp 17 | Ht 74.5 in | Wt 217.0 lb

## 2013-12-30 DIAGNOSIS — Z0000 Encounter for general adult medical examination without abnormal findings: Secondary | ICD-10-CM

## 2013-12-30 LAB — POCT URINALYSIS DIPSTICK
Bilirubin, UA: NEGATIVE
Blood, UA: NEGATIVE
Glucose, UA: NEGATIVE
Ketones, UA: NEGATIVE
Leukocytes, UA: NEGATIVE
Nitrite, UA: NEGATIVE
Protein, UA: NEGATIVE
Spec Grav, UA: 1.02
Urobilinogen, UA: 0.2
pH, UA: 7.5

## 2013-12-30 LAB — POCT CBC
Granulocyte percent: 56.1 %G (ref 37–80)
HCT, POC: 43 % — AB (ref 43.5–53.7)
Hemoglobin: 14.1 g/dL (ref 14.1–18.1)
Lymph, poc: 2.1 (ref 0.6–3.4)
MCH, POC: 29.3 pg (ref 27–31.2)
MCHC: 32.7 g/dL (ref 31.8–35.4)
MCV: 89.7 fL (ref 80–97)
MID (cbc): 0.4 (ref 0–0.9)
MPV: 7.6 fL (ref 0–99.8)
POC Granulocyte: 3.3 (ref 2–6.9)
POC LYMPH PERCENT: 36.6 %L (ref 10–50)
POC MID %: 7.3 %M (ref 0–12)
Platelet Count, POC: 224 10*3/uL (ref 142–424)
RBC: 4.79 M/uL (ref 4.69–6.13)
RDW, POC: 13.1 %
WBC: 5.8 10*3/uL (ref 4.6–10.2)

## 2013-12-30 LAB — POCT GLYCOSYLATED HEMOGLOBIN (HGB A1C): Hemoglobin A1C: 5

## 2013-12-30 NOTE — Patient Instructions (Signed)

## 2013-12-30 NOTE — Progress Notes (Signed)
° °  Subjective:    Patient ID: Austin Golden, male    DOB: 23-Jan-1971, 43 y.o.   MRN: [id number] This chart was scribed for [name], MD by Jolene Provostobert Halas, Medical Scribe. This patient was seen in Room 9 and the patient's care was started at 12:32 PM.  HPI HPI Comments: Austin SerBradley Candler is a 43 y.o. male who [facility]. Pt states he feels well. Pt states his father is doing well. Pt states he has no symptoms. Pt's mother passed away two years ago, but states his father is doing well. Pt states his sisters are doing well. Pt is a marathon runner, and Publishing copycompetitive swimmer.    Review of Systems  Constitutional: Negative for chills and fatigue.  HENT: Negative for rhinorrhea.   Genitourinary: Negative for dysuria, frequency and difficulty urinating.  Musculoskeletal: Negative for back pain.  Psychiatric/Behavioral: Negative for sleep disturbance.  All other systems reviewed and are negative.      Objective:   Physical Exam  Nursing note and vitals reviewed. Constitutional: He is oriented to person, place, and time. He appears well-developed and well-nourished.  HENT:  Head: Normocephalic and atraumatic.  Eyes: Pupils are equal, round, and reactive to light.  Neck: No JVD present.  Cardiovascular: Normal rate and regular rhythm.   Pulmonary/Chest: Effort normal and breath sounds normal. No respiratory distress.  Abdominal: Soft. There is no tenderness.  Neurological: He is alert and oriented to person, place, and time.  Skin: Skin is warm and dry.  Psychiatric: He has a normal mood and affect. His behavior is normal.  no hernia  Assessment & Plan:   Annual physical exam - Plan: POCT urinalysis dipstick, POCT glycosylated hemoglobin (Hb A1C), Comprehensive metabolic panel, Lipid panel  Signed, [name], MD

## 2013-12-31 LAB — COMPREHENSIVE METABOLIC PANEL
ALT: 20 U/L (ref 0–53)
AST: 24 U/L (ref 0–37)
Albumin: 3.9 g/dL (ref 3.5–5.2)
Alkaline Phosphatase: 39 U/L (ref 39–117)
BUN: 11 mg/dL (ref 6–23)
CO2: 24 mEq/L (ref 19–32)
Calcium: 8.8 mg/dL (ref 8.4–10.5)
Chloride: 104 mEq/L (ref 96–112)
Creat: 1.08 mg/dL (ref 0.50–1.35)
Glucose, Bld: 84 mg/dL (ref 70–99)
Potassium: 4.3 mEq/L (ref 3.5–5.3)
Sodium: 140 mEq/L (ref 135–145)
Total Bilirubin: 0.5 mg/dL (ref 0.2–1.2)
Total Protein: 5.8 g/dL — ABNORMAL LOW (ref 6.0–8.3)

## 2013-12-31 LAB — LIPID PANEL
Cholesterol: 183 mg/dL (ref 0–200)
HDL: 53 mg/dL (ref >39)
LDL Cholesterol: 107 mg/dL — ABNORMAL HIGH (ref 0–99)
Total CHOL/HDL Ratio: 3.5 Ratio
Triglycerides: 117 mg/dL (ref <150)
VLDL: 23 mg/dL (ref 0–40)

## 2014-03-19 ENCOUNTER — Encounter (HOSPITAL_COMMUNITY): Payer: Self-pay | Admitting: Emergency Medicine

## 2014-03-19 ENCOUNTER — Emergency Department (HOSPITAL_COMMUNITY): Payer: BLUE CROSS/BLUE SHIELD

## 2014-03-19 DIAGNOSIS — W108XXA Fall (on) (from) other stairs and steps, initial encounter: Secondary | ICD-10-CM | POA: Diagnosis not present

## 2014-03-19 DIAGNOSIS — S01111A Laceration without foreign body of right eyelid and periocular area, initial encounter: Secondary | ICD-10-CM | POA: Diagnosis not present

## 2014-03-19 DIAGNOSIS — S52592A Other fractures of lower end of left radius, initial encounter for closed fracture: Secondary | ICD-10-CM | POA: Insufficient documentation

## 2014-03-19 DIAGNOSIS — Y93K1 Activity, walking an animal: Secondary | ICD-10-CM | POA: Diagnosis not present

## 2014-03-19 DIAGNOSIS — Y9289 Other specified places as the place of occurrence of the external cause: Secondary | ICD-10-CM | POA: Diagnosis not present

## 2014-03-19 DIAGNOSIS — Z23 Encounter for immunization: Secondary | ICD-10-CM | POA: Diagnosis not present

## 2014-03-19 DIAGNOSIS — Y998 Other external cause status: Secondary | ICD-10-CM | POA: Insufficient documentation

## 2014-03-19 LAB — CBC WITH DIFFERENTIAL/PLATELET
BASOS PCT: 1 % (ref 0–1)
Basophils Absolute: 0.1 10*3/uL (ref 0.0–0.1)
Eosinophils Absolute: 0.2 10*3/uL (ref 0.0–0.7)
Eosinophils Relative: 3 % (ref 0–5)
HEMATOCRIT: 40.8 % (ref 39.0–52.0)
Hemoglobin: 14.4 g/dL (ref 13.0–17.0)
Lymphocytes Relative: 40 % (ref 12–46)
Lymphs Abs: 3.7 10*3/uL (ref 0.7–4.0)
MCH: 30.3 pg (ref 26.0–34.0)
MCHC: 35.3 g/dL (ref 30.0–36.0)
MCV: 85.7 fL (ref 78.0–100.0)
MONO ABS: 0.7 10*3/uL (ref 0.1–1.0)
MONOS PCT: 7 % (ref 3–12)
NEUTROS ABS: 4.6 10*3/uL (ref 1.7–7.7)
Neutrophils Relative %: 49 % (ref 43–77)
Platelets: 235 10*3/uL (ref 150–400)
RBC: 4.76 MIL/uL (ref 4.22–5.81)
RDW: 12.5 % (ref 11.5–15.5)
WBC: 9.2 10*3/uL (ref 4.0–10.5)

## 2014-03-19 LAB — COMPREHENSIVE METABOLIC PANEL
ALT: 20 U/L (ref 0–53)
AST: 29 U/L (ref 0–37)
Albumin: 4 g/dL (ref 3.5–5.2)
Alkaline Phosphatase: 48 U/L (ref 39–117)
Anion gap: 5 (ref 5–15)
BUN: 16 mg/dL (ref 6–23)
CALCIUM: 8.9 mg/dL (ref 8.4–10.5)
CHLORIDE: 102 meq/L (ref 96–112)
CO2: 29 mmol/L (ref 19–32)
CREATININE: 1.24 mg/dL (ref 0.50–1.35)
GFR calc Af Amer: 80 mL/min — ABNORMAL LOW (ref >90)
GFR calc non Af Amer: 69 mL/min — ABNORMAL LOW (ref >90)
GLUCOSE: 115 mg/dL — AB (ref 70–99)
Potassium: 3.3 mmol/L — ABNORMAL LOW (ref 3.5–5.1)
Sodium: 136 mmol/L (ref 135–145)
Total Bilirubin: 0.6 mg/dL (ref 0.3–1.2)
Total Protein: 6 g/dL (ref 6.0–8.3)

## 2014-03-19 MED ORDER — FENTANYL CITRATE 0.05 MG/ML IJ SOLN
50.0000 ug | Freq: Once | INTRAMUSCULAR | Status: AC
  Administered 2014-03-19: 50 ug via INTRAVENOUS
  Filled 2014-03-19: qty 2

## 2014-03-19 MED ORDER — ONDANSETRON HCL 4 MG/2ML IJ SOLN
4.0000 mg | Freq: Once | INTRAMUSCULAR | Status: AC
  Administered 2014-03-19: 4 mg via INTRAVENOUS
  Filled 2014-03-19: qty 2

## 2014-03-19 NOTE — ED Notes (Signed)
Taking dog out slid up on wet deck and fell down concrete steps.  Pain in left wrist with deformity noted.  Several lacerations to left side of face under eye and to eyebrow.  Abrasions noted to bilat knees.

## 2014-03-20 ENCOUNTER — Emergency Department (HOSPITAL_COMMUNITY): Payer: BLUE CROSS/BLUE SHIELD

## 2014-03-20 ENCOUNTER — Emergency Department (HOSPITAL_COMMUNITY)
Admission: EM | Admit: 2014-03-20 | Discharge: 2014-03-20 | Disposition: A | Discharge status: Home / Self Care | Payer: BLUE CROSS/BLUE SHIELD | Attending: Emergency Medicine | Admitting: Emergency Medicine

## 2014-03-20 DIAGNOSIS — IMO0002 Reserved for concepts with insufficient information to code with codable children: Secondary | ICD-10-CM

## 2014-03-20 DIAGNOSIS — S5292XA Unspecified fracture of left forearm, initial encounter for closed fracture: Secondary | ICD-10-CM

## 2014-03-20 DIAGNOSIS — W19XXXA Unspecified fall, initial encounter: Secondary | ICD-10-CM

## 2014-03-20 MED ORDER — TETANUS-DIPHTH-ACELL PERTUSSIS 5-2.5-18.5 LF-MCG/0.5 IM SUSP
0.5000 mL | Freq: Once | INTRAMUSCULAR | Status: AC
  Administered 2014-03-20: 0.5 mL via INTRAMUSCULAR
  Filled 2014-03-20: qty 0.5

## 2014-03-20 MED ORDER — MORPHINE SULFATE 4 MG/ML IJ SOLN
4.0000 mg | Freq: Once | INTRAMUSCULAR | Status: AC
  Administered 2014-03-20: 4 mg via INTRAVENOUS
  Filled 2014-03-20: qty 1

## 2014-03-20 MED ORDER — LIDOCAINE-EPINEPHRINE 1 %-1:100000 IJ SOLN
10.0000 mL | Freq: Once | INTRAMUSCULAR | Status: AC
  Administered 2014-03-20: 10 mL via INTRADERMAL
  Filled 2014-03-20 (×2): qty 1

## 2014-03-20 MED ORDER — HYDROCODONE-ACETAMINOPHEN 5-325 MG PO TABS: 1.0000 | ORAL_TABLET | Freq: Two times a day (BID) | ORAL | Status: DC | PRN

## 2014-03-20 NOTE — ED Provider Notes (Signed)
CSN: [id number]     Arrival date & time 03/19/14  2226 History  This chart was scribed for Tomasita Crumble, MD by Karle Plumber, ED Scribe. This patient was seen in room B19C/B19C and the patient's care was started at 12:36 AM.  Chief Complaint  Patient presents with  . Wrist Pain  . Facial Laceration    over left eye  . Fall   Patient is a 44 y.o. male presenting with wrist pain and fall. The history is provided by the patient. No language interpreter was used.  Wrist Pain  Fall    HPI Comments:  Austin Golden is a 44 y.o. male who presents to the Emergency Department complaining of a fall. He states he was walking his dog and slipped on some wet concrete stairs and landed on his face. He reports severe pain in his left wrist and lacerations to the left side of his face and knees bilaterally. He reports associated bleeding of the lacerations and abrasions that has since resolved. He has not taken anything for pain PTA. Touching the left wrist and the facial wounds makes the pain worse. Denies alleviating factors. Denies taking anticoagulants. Denies LOC, nausea, vomiting, numbness, tingling or weakness of the LUE. Pt is unaware of his last tetanus vaccination. PMH of cocaine and other substance abuse.  Past Medical History  Diagnosis Date  . Cocaine addiction   . Substance abuse    History reviewed. No pertinent past surgical history. No family history on file. History  Substance Use Topics  . Smoking status: Never Smoker   . Smokeless tobacco: Not on file  . Alcohol Use: No    Review of Systems  Gastrointestinal: Negative for nausea and vomiting.  Musculoskeletal: Positive for arthralgias.  Skin: Positive for wound.  Neurological: Negative for syncope, weakness and numbness.  All other systems reviewed and are negative.   Allergies  Review of patient's allergies indicates no known allergies.  Home Medications   Prior to Admission medications   Not on File   Triage  Vitals: BP 127/77 mmHg  Pulse 78  Temp(Src) 98.2 F (36.8 C) (Oral)  Resp 18  Ht  (1.905 m)  Wt 220 lb (99.791 kg)  BMI 27.50 kg/m2  SpO2 99% Physical Exam  Constitutional: He is oriented to person, place, and time. Vital signs are normal. He appears well-developed and well-nourished.  Non-toxic appearance. He does not appear ill. No distress.  HENT:  Head: Normocephalic.  Nose: Nose normal.  Mouth/Throat: Oropharynx is clear and moist. No oropharyngeal exudate.  3 cm right laceration just above forehead. Left 5 cm gaping laceration. Galea intact. Significant eyebrow tissue lost. Left infra orbital swelling and tenderness. No step off.  Eyes: Conjunctivae and EOM are normal. Pupils are equal, round, and reactive to light. No scleral icterus.  Neck: Normal range of motion. Neck supple. No tracheal deviation, no edema, no erythema and normal range of motion present. No thyroid mass and no thyromegaly present.  Cardiovascular: Normal rate, regular rhythm, S1 normal, S2 normal, normal heart sounds, intact distal pulses and normal pulses.  Exam reveals no gallop and no friction rub.   No murmur heard. Pulses:      Radial pulses are 2+ on the right side, and 2+ on the left side.       Dorsalis pedis pulses are 2+ on the right side, and 2+ on the left side.  Normal pulses of LUE.  Pulmonary/Chest: Effort normal and breath sounds normal. No respiratory distress.  He has no wheezes. He has no rhonchi. He has no rales.  Abdominal: Soft. Normal appearance and bowel sounds are normal. He exhibits no distension, no ascites and no mass. There is no hepatosplenomegaly. There is no tenderness. There is no rebound, no guarding and no CVA tenderness.  Musculoskeletal: Normal range of motion. He exhibits no edema or tenderness.  Obvious left wrist deformity. Abrasions to bilateral knees and bilateral feet.  Lymphadenopathy:    He has no cervical adenopathy.  Neurological: He is alert and oriented to  person, place, and time. He has normal strength. No cranial nerve deficit or sensory deficit. GCS eye subscore is 4. GCS verbal subscore is 5. GCS motor subscore is 6.  Sensations intact.  Skin: Skin is warm, dry and intact. No petechiae and no rash noted. He is not diaphoretic. No erythema. No pallor.  Psychiatric: He has a normal mood and affect. His behavior is normal. Judgment normal.  Nursing note and vitals reviewed.   ED Course  Procedures (including critical care time) DIAGNOSTIC STUDIES: Oxygen Saturation is 99% on RA, normal by my interpretation.   COORDINATION OF CARE: 12:45 AM- Will splint fractured left wrist, suture left facial laceration and CT face. Will update tetanus vaccination. Pt verbalizes understanding and agrees to plan.  LACERATION REPAIR PROCEDURE NOTE   Medications  Tdap (BOOSTRIX) injection 0.5 mL (not administered)  lidocaine-EPINEPHrine (XYLOCAINE W/EPI) 1 %-1:100000 (with pres) injection 10 mL (not administered)  morphine 4 MG/ML injection 4 mg (not administered)  ondansetron (ZOFRAN) injection 4 mg (4 mg Intravenous Given 03/19/14 2243)  fentaNYL (SUBLIMAZE) injection 50 mcg (50 mcg Intravenous Given 03/19/14 2245)   Labs Review Labs Reviewed  COMPREHENSIVE METABOLIC PANEL - Abnormal; Notable for the following:    Potassium 3.3 (*)    Glucose, Bld 115 (*)    GFR calc non Af Amer 69 (*)    GFR calc Af Amer 80 (*)    All other components within normal limits  CBC WITH DIFFERENTIAL    Imaging Review Dg Wrist Complete Left  03/20/2014   CLINICAL DATA:  Left wrist pain after slip and fall down porch steps.  EXAM: LEFT WRIST - COMPLETE 3+ VIEW  COMPARISON:  None.  FINDINGS: Impacted distal radius fracture extending to the distal radial ulnar joint. Mild apex volar angulation. Nondisplaced component extends to the dorsal cortex of the radiocarpal joint. There is no associated ulna styloid fracture. Carpals remain normally aligned. There is soft tissue  edema about the fracture site.  IMPRESSION: Impacted distal radius fracture. Nondisplaced component extends to the dorsal radiocarpal articulation.   Electronically Signed   By: Rubye Oaks M.D.   On: 03/20/2014 00:20     EKG Interpretation None      MDM   Final diagnoses:  Fall    Patient presents emergency department after a fall. Will obtain CT scan of the face due to swelling and large laceration to evaluate for fracture. See laceration note below.  Austin Golden was intact. Splint was applied to left upper extremity. Repeat evaluation reveals normal pulses and sensation. Tetanus shot updated. Patient is advised to follow-up with orthopedic surgery for continued management of fracture. Return precautions given.  His vital signs remain within his normal limits and he is safe for DC.  I personally performed the services described in this documentation, which was scribed in my presence. The recorded information has been reviewed and is accurate.  LACERATION REPAIRn - Complex laceration Performed by: Tomasita Crumble Authorized byTomasita Crumble Consent:  Verbal consent obtained. Risks and benefits: risks, benefits and alternatives were discussed Consent given by: patient Patient identity confirmed: provided demographic data Prepped and Draped in normal sterile fashion Wound explored  Laceration Location: L eyebrow  Laceration Length: 5 cm  No Foreign Bodies seen or palpated  Anesthesia: local infiltration  Local anesthetic: lidocaine 1% with epinephrine  Anesthetic total: 7 ml  Irrigation method: syringe Amount of cleaning: standard  Skin closure: sutures  Number of sutures: 3 deep plain gut sutures 4-0, 5 superficial ethilon sutures 4-0  Technique: simple interrupted  Patient tolerance: Patient tolerated the procedure well with no immediate complications.   Tomasita CrumbleAdeleke Juleen Sorrels, MD 03/20/14 [id number]

## 2014-03-20 NOTE — Discharge Instructions (Signed)
Laceration Care, Adult Austin Golden, you were seen today after a fall. You have a broken bone in her left wrist, follow-up with orthopedic surgery within 3 days for continued management. Return to the emergency department immediately for any worsening pain or swelling in that hand. Your laceration was sutured closed, follow-up with your primary care physician within 7 days for a wound check and to have his sutures removed. For any signs of infection of the wound come back to emergency department earlier.Thank you. A laceration is a cut that goes through all layers of the skin. The cut goes into the tissue beneath the skin. HOME CARE For stitches (sutures) or staples:  Keep the cut clean and dry.  If you have a bandage (dressing), change it at least once a day. Change the bandage if it gets wet or dirty, or as told by your doctor.  Wash the cut with soap and water 2 times a day. Rinse the cut with water. Pat it dry with a clean towel.  Put a thin layer of medicated cream on the cut as told by your doctor.  You may shower after the first 24 hours. Do not soak the cut in water until the stitches are removed.  Only take medicines as told by your doctor.  Have your stitches or staples removed as told by your doctor. For skin adhesive strips:  Keep the cut clean and dry.  Do not get the strips wet. You may take a bath, but be careful to keep the cut dry.  If the cut gets wet, pat it dry with a clean towel.  The strips will fall off on their own. Do not remove the strips that are still stuck to the cut. For wound glue:  You may shower or take baths. Do not soak or scrub the cut. Do not swim. Avoid heavy sweating until the glue falls off on its own. After a shower or bath, pat the cut dry with a clean towel.  Do not put medicine on your cut until the glue falls off.  If you have a bandage, do not put tape over the glue.  Avoid lots of sunlight or tanning lamps until the glue falls off. Put  sunscreen on the cut for the first year to reduce your scar.  The glue will fall off on its own. Do not pick at the glue. You may need a tetanus shot if:  You cannot remember when you had your last tetanus shot.  You have never had a tetanus shot. If you need a tetanus shot and you choose not to have one, you may get tetanus. Sickness from tetanus can be serious. GET HELP RIGHT AWAY IF:   Your pain does not get better with medicine.  Your arm, hand, leg, or foot loses feeling (numbness) or changes color.  Your cut is bleeding.  Your joint feels weak, or you cannot use your joint.  You have painful lumps on your body.  Your cut is red, puffy (swollen), or painful.  You have a red line on the skin near the cut.  You have yellowish-white fluid (pus) coming from the cut.  You have a fever.  You have a bad smell coming from the cut or bandage.  Your cut breaks open before or after stitches are removed.  You notice something coming out of the cut, such as wood or glass.  You cannot move a finger or toe. MAKE SURE YOU:   Understand these instructions.  Will watch your condition.  Will get help right away if you are not doing well or get worse. Document Released: 08/08/2007 Document Revised: 05/14/2011 Document Reviewed: 08/15/2010 [facility] Patient Information 2015 [place] [place]. This information is not intended to replace advice given to you by your health care provider. Make sure you discuss any questions you have with your health care provider. Wrist Fracture A wrist fracture is a break or crack in one of the bones of your wrist. Your wrist is made up of eight small bones at the palm of your hand (carpal bones) and two long bones that make up your forearm (radius and ulna). The goal of treatment is to hold the injured bone in place while it heals. Surgery may or may not be needed to care for your injured wrist.  HOME CARE  Keep your injured wrist raised (elevated). Move  your fingers as much as you can.  Do not put pressure on any part of your cast or splint. It may break.  Use a plastic bag to protect your cast or splint from water while bathing or showering. Do not lower your cast or splint into water.  Take medicines only as told by your doctor.  Keep your cast or splint clean and dry. If it gets wet, damaged, or suddenly feels too tight, tell your doctor right away.  Do not use any tobacco products including cigarettes, chewing tobacco, or electronic cigarettes. Tobacco can slow bone healing. If you need help quitting, ask your doctor.  Keep all follow-up visits as told by your doctor. This is important.  Ask your doctor if you should take supplements of calcium and vitamins C and D. GET HELP IF:   Your cast or splint is damaged, breaks, or gets wet.  You have a fever.  You have chills.  You have very bad pain that does not go away.  You have more swelling (inflammation) than before the cast was put on. GET HELP RIGHT AWAY IF:   Your hand or fingernails on the injured arm turn blue or gray, or feel cold or numb.  You lose some feeling in the fingers of your injured arm. MAKE SURE YOU:   Understand these instructions.  Will watch your condition.  Will get help right away if you are not doing well or get worse. Document Released: 08/08/2007 Document Revised: 07/06/2013 Document Reviewed: 09/03/2011 [facility] Patient Information 2015 [place], [place]. This information is not intended to replace advice given to you by your health care provider. Make sure you discuss any questions you have with your health care provider.

## 2014-03-20 NOTE — ED Notes (Signed)
MD made aware of pain rated 10/10.

## 2014-03-20 NOTE — ED Notes (Signed)
Taken for CT via radiology department at this time.

## 2014-03-20 NOTE — ED Notes (Signed)
Arrived back from CT

## 2014-03-24 ENCOUNTER — Emergency Department (HOSPITAL_COMMUNITY)
Admission: EM | Admit: 2014-03-24 | Discharge: 2014-03-24 | Disposition: A | Discharge status: Home / Self Care | Payer: BLUE CROSS/BLUE SHIELD | Attending: Emergency Medicine | Admitting: Emergency Medicine

## 2014-03-24 ENCOUNTER — Encounter (HOSPITAL_COMMUNITY): Payer: Self-pay | Admitting: Emergency Medicine

## 2014-03-24 DIAGNOSIS — G44309 Post-traumatic headache, unspecified, not intractable: Secondary | ICD-10-CM

## 2014-03-24 DIAGNOSIS — H5710 Ocular pain, unspecified eye: Secondary | ICD-10-CM | POA: Insufficient documentation

## 2014-03-24 DIAGNOSIS — F0781 Postconcussional syndrome: Secondary | ICD-10-CM | POA: Insufficient documentation

## 2014-03-24 NOTE — ED Notes (Signed)
Pt. reports headache , left periorbital pain , and " ringing" at ears today , seen here last Friday after he slipped and fell at home sustained left eyebrow laceration and left radial fracture . Alert and oriented/ respirations unlabored .

## 2014-03-24 NOTE — ED Provider Notes (Signed)
CSN: [id number]     Arrival date & time 03/24/14  2134 History   First MD Initiated Contact with Patient 03/24/14 2151     No chief complaint on file.    (Consider location/radiation/quality/duration/timing/severity/associated sxs/prior Treatment) Patient is a 44 y.o. male presenting with headaches. The history is provided by the patient. No language interpreter was used.  Headache Pain location:  L temporal and frontal Radiates to:  Does not radiate Duration:  5 days Associated symptoms: eye pain   Associated symptoms: no fever, no nausea, no neck pain, no visual change, no vomiting and no weakness   Associated symptoms comment:  Left sided headache for the past 5 days since a fall. He was seen on 03/19/14 after a mechanical fall, landing face down on wooden surface causing multiple facial lacerations. He had nausea and vomiting that night only and no nausea since. He received sutures to left eyebrow laceration and reports he returned to work today. He has had a persistent headache since the fall and today felt he had difficulty concentrating and felt off balance at times while walking. No visual changes, N, V or repeat fall.   Past Medical History  Diagnosis Date  . Cocaine addiction   . Substance abuse    History reviewed. No pertinent past surgical history. No family history on file. History  Substance Use Topics  . Smoking status: Never Smoker   . Smokeless tobacco: Not on file  . Alcohol Use: No    Review of Systems  Constitutional: Negative for fever and chills.  Eyes: Positive for pain. Negative for visual disturbance.  Gastrointestinal: Negative.  Negative for nausea and vomiting.  Musculoskeletal: Negative.  Negative for neck pain.  Skin: Positive for wound.  Neurological: Positive for headaches. Negative for syncope.      Allergies  Review of patient's allergies indicates no known allergies.  Home Medications   Prior to Admission medications   Medication Sig  Start Date End Date Taking? Authorizing Provider  HYDROcodone-acetaminophen (NORCO/VICODIN) 5-325 MG per tablet Take 1 tablet by mouth 2 (two) times daily as needed for severe pain. 03/20/14   Tomasita Crumble, MD   BP 127/68 mmHg  Pulse 74  Temp(Src) 97.8 F (36.6 C) (Oral)  Resp 17  Ht  (1.905 m)  Wt 220 lb (99.791 kg)  BMI 27.50 kg/m2  SpO2 99% Physical Exam  Constitutional: He is oriented to person, place, and time. He appears well-developed and well-nourished.  Neck: Normal range of motion.  Pulmonary/Chest: Effort normal.  Musculoskeletal: Normal range of motion.  No midline or paracervical tenderness.   Neurological: He is alert and oriented to person, place, and time. Coordination normal.  Speech clear and focused. No deficits of coordination. CN's 3-12 grossly intact. Ambulatory without ataxia.   Skin: Skin is warm and dry.  Intact sutures to left eyebrow without evidence of infection.   Psychiatric: He has a normal mood and affect.    ED Course  Procedures (including critical care time) Labs Review Labs Reviewed - No data to display  Imaging Review No results found.   EKG Interpretation None      MDM   Final diagnoses:  None    1. Post concussive syndrome  He has a normal neurologic exam today without deficits of concern of underlying injury. Dr. Ethelda Chick has been in to evaluate the patient as well. Do not feel additional imaging is warranted with post-concussive symptoms and no deficit. Refer to neurology.    Ocie Cornfield  Junius FinnerUpstill, PA-C 03/24/14 2224  Doug SouSam Jacubowitz, MD 03/24/14 2312

## 2014-03-24 NOTE — ED Provider Notes (Signed)
Patient fell 5 days ago landing on his face. Also sustained left wrist fractures result of fall. He presents today feeling ringing in his ears slightly off balance. There was no loss of consciousness. He vomited the night of the fall but not since. No nausea present. No other injury. On exam patient is alert Glasgow Coma Score 15 gait is normal cranial nerves II through XII grossly intact motor strength 5 over 5 overall. There is no scalp hematoma no hemotympanum no Battle's sign no raccoon's eyes. I off I don't feel the patient needs CT scan of the brain. I offered him a CT scan of the brain which he declined. 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

## 2014-03-24 NOTE — Discharge Instructions (Signed)
Post-Concussion Syndrome Post-concussion syndrome describes the symptoms that can occur after a head injury. These symptoms can last from weeks to months. CAUSES  It is not clear why some head injuries cause post-concussion syndrome. It can occur whether your head injury was mild or severe and whether you were wearing head protection or not.  SIGNS AND SYMPTOMS  Memory difficulties.  Dizziness.  Headaches.  Double vision or blurry vision.  Sensitivity to light.  Hearing difficulties.  Depression.  Tiredness.  Weakness.  Difficulty with concentration.  Difficulty sleeping or staying asleep.  Vomiting.  Poor balance or instability on your feet.  Slow reaction time.  Difficulty learning and remembering things you have heard. DIAGNOSIS  There is no test to determine whether you have post-concussion syndrome. Your health care provider may order an imaging scan of your brain, such as a CT scan, to check for other problems that may be causing your symptoms (such as severe injury inside your skull). TREATMENT  Usually, these problems disappear over time without medical care. Your health care provider may prescribe medicine to help ease your symptoms. It is important to follow up with a neurologist to evaluate your recovery and address any lingering symptoms or issues. HOME CARE INSTRUCTIONS   Only take over-the-counter or prescription medicines for pain, discomfort, or fever as directed by your health care provider. Do not take aspirin. Aspirin can slow blood clotting.  Sleep with your head slightly elevated to help with headaches.  Avoid any situation where there is potential for another head injury (football, hockey, soccer, basketball, martial arts, downhill snow sports, and horseback riding). Your condition will get worse every time you experience a concussion. You should avoid these activities until you are evaluated by the appropriate follow-up health care  providers.  Keep all follow-up appointments as directed by your health care provider. SEEK IMMEDIATE MEDICAL CARE IF:  You develop confusion or unusual drowsiness.  You cannot wake the injured person.  You develop nausea or persistent, forceful vomiting.  You feel like you are moving when you are not (vertigo).  You notice the injured person's eyes moving rapidly back and forth. This may be a sign of vertigo.  You have convulsions or faint.  You have severe, persistent headaches that are not relieved by medicine.  You cannot use your arms or legs normally.  Your pupils change size.  You have clear or bloody discharge from the nose or ears.  Your problems are getting worse, not better. MAKE SURE YOU:  Understand these instructions.  Will watch your condition.  Will get help right away if you are not doing well or get worse. Document Released: 08/11/2001 Document Revised: 12/10/2012 Document Reviewed: 05/27/2013 ExitCare Patient Information 2015 [employer]. This information is not intended to replace advice given to you by your health care provider. Make sure you discuss any questions you have with your health care provider.  

## 2014-03-30 ENCOUNTER — Encounter (HOSPITAL_COMMUNITY): Payer: Self-pay | Admitting: *Deleted

## 2014-03-30 ENCOUNTER — Emergency Department (HOSPITAL_COMMUNITY)
Admission: EM | Admit: 2014-03-30 | Discharge: 2014-03-30 | Disposition: A | Discharge status: Home / Self Care | Payer: BLUE CROSS/BLUE SHIELD | Source: Home / Self Care | Attending: Family Medicine | Admitting: Family Medicine

## 2014-03-30 DIAGNOSIS — Z4802 Encounter for removal of sutures: Secondary | ICD-10-CM

## 2014-03-30 NOTE — Discharge Instructions (Signed)
Use bacitracin twice a day for 3-5 d. Return as needed.

## 2014-03-30 NOTE — ED Provider Notes (Signed)
CSN: [id number]     Arrival date & time 03/30/14  1104 History   First MD Initiated Contact with Patient 03/30/14 1129     Chief Complaint  Patient presents with  . Suture / Staple Removal   (Consider location/radiation/quality/duration/timing/severity/associated sxs/prior Treatment) Patient is a 44 y.o. male presenting with suture removal. The history is provided by the patient.  Suture / Staple Removal This is a new problem. The current episode started more than 1 week ago (injury 10 d ago with facial lac and left wrist fx., had er f/u , here for sr., no problems.).    Past Medical History  Diagnosis Date  . Cocaine addiction   . Substance abuse    History reviewed. No pertinent past surgical history. History reviewed. No pertinent family history. History  Substance Use Topics  . Smoking status: Never Smoker   . Smokeless tobacco: Not on file  . Alcohol Use: No    Review of Systems  Constitutional: Negative.   Skin: Positive for wound.    Allergies  Review of patient's allergies indicates no known allergies.  Home Medications   Prior to Admission medications   Medication Sig Start Date End Date Taking? Authorizing Provider  HYDROcodone-acetaminophen (NORCO/VICODIN) 5-325 MG per tablet Take 1 tablet by mouth 2 (two) times daily as needed for severe pain. 03/20/14   Tomasita CrumbleAdeleke Oni, MD   BP 107/74 mmHg  Pulse 75  Temp(Src) 97.9 F (36.6 C) (Oral)  Resp 14  SpO2 97% Physical Exam  Constitutional: He is oriented to person, place, and time. He appears well-developed and well-nourished.  HENT:  Head: Normocephalic.  Eyes: Conjunctivae are normal. Pupils are equal, round, and reactive to light.  Neurological: He is alert and oriented to person, place, and time. No cranial nerve deficit.  Skin: Skin is warm and dry.  Left eyebrow lac healed, 5 sutures removed.  Nursing note and vitals reviewed.   ED Course  Procedures (including critical care time) Labs Review Labs  Reviewed - No data to display  Imaging Review No results found.   MDM   1. Visit for suture removal        Linna HoffJames D Adna Nofziger, MD 03/31/14 2100

## 2014-03-30 NOTE — ED Notes (Signed)
Pt  Here  For  Suture  Removal  l   Eyebrow     Sutures have  Been in   For  10  Days      Appear  Well   Healing

## 2014-08-02 ENCOUNTER — Ambulatory Visit (INDEPENDENT_AMBULATORY_CARE_PROVIDER_SITE_OTHER): Payer: BLUE CROSS/BLUE SHIELD | Admitting: Family Medicine

## 2014-08-02 VITALS — BP 130/80 | HR 75 | Temp 98.7°F | Resp 16 | Ht 75.0 in | Wt 223.0 lb

## 2014-08-02 DIAGNOSIS — H9201 Otalgia, right ear: Secondary | ICD-10-CM | POA: Diagnosis not present

## 2014-08-02 NOTE — Progress Notes (Signed)
[facility] [address], [place] [address] [phone]  Date:  08/02/2014   Name:  Austin Golden   DOB:  [birth year]   MRN:  [id number]  PCP:  No PCP Per Patient    Chief Complaint: Ear Pain   History of Present Illness:  Austin Golden is a 44 y.o. very pleasant male patient who presents with the following:  Here today with complaint of recurrent ear pain for the last 9 months or so.  It always occurs in the right, never in the left.  The ear will hurt 3-4 x a week now.  The pain comes on suddenly and may last a couple of hours.  It has gone away now- it was there earlier today.  He is a Optometristswim coach.  He is not aware of any other instigating cause of the pain.  Wonders if he might have swimmer's ear No drainage from the ear.   No change in his hearing No tinnitus.  No ST.  He does have some pain with moving his jaw/ chewing only when he has the ear pain  No fever or other systemic sx.  No recent cold sx.    There are no active problems to display for this patient.   Past Medical History  Diagnosis Date  . Cocaine addiction   . Substance abuse     No past surgical history on file.  History  Substance Use Topics  . Smoking status: Never Smoker   . Smokeless tobacco: Not on file  . Alcohol Use: No    No family history on file.  No Known Allergies  Medication list has been reviewed and updated.  No current outpatient prescriptions on file prior to visit.   No current facility-administered medications on file prior to visit.    Review of Systems:  As per HPI- otherwise negative.   Physical Examination: Filed Vitals:   08/02/14 1219  BP: 130/80  Pulse: 75  Temp: 98.7 F (37.1 C)  Resp: 16   Filed Vitals:   08/02/14 1219  Height: 6\' 3"  (1.905 m)  Weight: 223 lb (101.152 kg)   Body mass index is 27.87 kg/(m^2). Ideal Body Weight: Weight in (lb) to have BMI = 25: 199.6  GEN: WDWN, NAD, Non-toxic, A & O x 3, looks  well HEENT: Atraumatic, Normocephalic. Neck supple. No masses, No LAD.  Bilateral TM and ear canals wnl, oropharynx normal.  PEERL,EOMI.  Nasal cavity normal.  No tenderness over bilateral TMJs Ears and Nose: No external deformity. CV: RRR, No M/G/R. No JVD. No thrill. No extra heart sounds. PULM: CTA B, no wheezes, crackles, rhonchi. No retractions. No resp. distress. No accessory muscle use. EXTR: No c/c/e NEURO Normal gait.  PSYCH: Normally interactive. Conversant. Not depressed or anxious appearing.  Calm demeanor.    Assessment and Plan: Right ear pain - Plan: Ambulatory referral to ENT  Here today with right ear pain for several months, now more frequent, no apparent cause.  May be related to TMJ.  He would like to go ahead and see ENT so did this referral for him.  He will let me know if this gets worse   Signed Abbe AmsterdamJessica Zeev Deakins, MD

## 2014-08-02 NOTE — Patient Instructions (Addendum)
We will refer you to ENT to look at your ear. At this time there is no sign of infection Try taking ibuprofen 400 mg three times a day for 3 or 4 days to see if it makes a difference.  If anything changes please let me know

## 2014-10-14 ENCOUNTER — Encounter (HOSPITAL_COMMUNITY): Payer: Self-pay | Admitting: *Deleted

## 2014-10-14 ENCOUNTER — Emergency Department (HOSPITAL_COMMUNITY)
Admission: EM | Admit: 2014-10-14 | Discharge: 2014-10-14 | Disposition: A | Discharge status: Home / Self Care | Payer: BLUE CROSS/BLUE SHIELD | Attending: Emergency Medicine | Admitting: Emergency Medicine

## 2014-10-14 ENCOUNTER — Emergency Department (HOSPITAL_COMMUNITY): Payer: BLUE CROSS/BLUE SHIELD

## 2014-10-14 DIAGNOSIS — I4891 Unspecified atrial fibrillation: Secondary | ICD-10-CM | POA: Diagnosis not present

## 2014-10-14 DIAGNOSIS — R002 Palpitations: Secondary | ICD-10-CM | POA: Diagnosis present

## 2014-10-14 LAB — RAPID URINE DRUG SCREEN, HOSP PERFORMED
AMPHETAMINES: NOT DETECTED
Barbiturates: NOT DETECTED
Benzodiazepines: NOT DETECTED
Cocaine: NOT DETECTED
OPIATES: NOT DETECTED
Tetrahydrocannabinol: NOT DETECTED

## 2014-10-14 LAB — CBC WITH DIFFERENTIAL/PLATELET
Basophils Absolute: 0.1 10*3/uL (ref 0.0–0.1)
Basophils Relative: 1 % (ref 0–1)
EOS ABS: 0.2 10*3/uL (ref 0.0–0.7)
Eosinophils Relative: 3 % (ref 0–5)
HCT: 42 % (ref 39.0–52.0)
Hemoglobin: 14.1 g/dL (ref 13.0–17.0)
LYMPHS PCT: 31 % (ref 12–46)
Lymphs Abs: 1.6 10*3/uL (ref 0.7–4.0)
MCH: 29.5 pg (ref 26.0–34.0)
MCHC: 33.6 g/dL (ref 30.0–36.0)
MCV: 87.9 fL (ref 78.0–100.0)
MONO ABS: 0.5 10*3/uL (ref 0.1–1.0)
Monocytes Relative: 9 % (ref 3–12)
NEUTROS PCT: 56 % (ref 43–77)
Neutro Abs: 3 10*3/uL (ref 1.7–7.7)
PLATELETS: 197 10*3/uL (ref 150–400)
RBC: 4.78 MIL/uL (ref 4.22–5.81)
RDW: 12.7 % (ref 11.5–15.5)
WBC: 5.3 10*3/uL (ref 4.0–10.5)

## 2014-10-14 LAB — BASIC METABOLIC PANEL
ANION GAP: 6 (ref 5–15)
BUN: 13 mg/dL (ref 6–20)
CHLORIDE: 105 mmol/L (ref 101–111)
CO2: 30 mmol/L (ref 22–32)
Calcium: 9 mg/dL (ref 8.9–10.3)
Creatinine, Ser: 1.09 mg/dL (ref 0.61–1.24)
Glucose, Bld: 79 mg/dL (ref 65–99)
Potassium: 4.8 mmol/L (ref 3.5–5.1)
SODIUM: 141 mmol/L (ref 135–145)

## 2014-10-14 LAB — TSH: TSH: 2.137 u[IU]/mL (ref 0.350–4.500)

## 2014-10-14 LAB — MAGNESIUM: MAGNESIUM: 1.9 mg/dL (ref 1.7–2.4)

## 2014-10-14 NOTE — ED Provider Notes (Signed)
CSN: [id number]     Arrival date & time 10/14/14  0915 History   First MD Initiated Contact with Patient 10/14/14 [phone]     Chief Complaint  Patient presents with  . Palpitations  . Atrial Fibrillation     (Consider location/radiation/quality/duration/timing/severity/associated sxs/prior Treatment) Patient is a 44 y.o. male presenting with palpitations and atrial fibrillation. The history is provided by the patient.  Palpitations Associated symptoms: no back pain, no chest pain, no nausea, no numbness, no shortness of breath, no vomiting and no weakness   Atrial Fibrillation Pertinent negatives include no chest pain, no abdominal pain, no headaches and no shortness of breath.   patient presents with new onset atrial fibrillation. 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 States he was going to sign up for one night and EKG and found the A. fib yesterday. States that he's felt a little bad for the last 4 days to 5 days. May have few palpitations. States his heart rate is going little faster than normal but still staying under 100. No fevers or chills. No cough. He does drink caffeine but denies other drug use. 10 years ago he had a cocaine problem but none since.  Past Medical History  Diagnosis Date  . Cocaine addiction   . Substance abuse    History reviewed. No pertinent past surgical history. History reviewed. No pertinent family history. Social History  Substance Use Topics  . Smoking status: Never Smoker   . Smokeless tobacco: None  . Alcohol Use: No    Review of Systems  Constitutional: Positive for fatigue. Negative for activity change and appetite change.  Eyes: Negative for pain.  Respiratory: Negative for chest tightness and shortness of breath.   Cardiovascular: Positive for palpitations. Negative for chest pain and leg swelling.  Gastrointestinal: Negative for nausea, vomiting, abdominal pain and diarrhea.  Genitourinary: Negative for flank pain.   Musculoskeletal: Negative for back pain and neck stiffness.  Skin: Negative for rash.  Neurological: Negative for weakness, numbness and headaches.  Psychiatric/Behavioral: Negative for behavioral problems.      Allergies  Review of patient's allergies indicates no known allergies.  Home Medications   Prior to Admission medications   Not on File   BP 102/69 mmHg  Pulse 80  Temp(Src) 98 F (36.7 C) (Oral)  Resp 10  Ht  (1.88 m)  Wt 225 lb (102.059 kg)  BMI 28.88 kg/m2  SpO2 100% Physical Exam  Constitutional: He is oriented to person, place, and time. He appears well-developed and well-nourished.  HENT:  Head: Normocephalic and atraumatic.  Eyes: EOM are normal. Pupils are equal, round, and reactive to light.  Neck: Normal range of motion. Neck supple.  Cardiovascular: Normal rate.   No murmur heard. Pulmonary/Chest: Effort normal and breath sounds normal.  Abdominal: Soft. Bowel sounds are normal. He exhibits no distension.  Musculoskeletal: Normal range of motion. He exhibits no edema.  Neurological: He is alert and oriented to person, place, and time. No cranial nerve deficit.  Skin: Skin is warm and dry.  Nursing note and vitals reviewed.   ED Course  Procedures (including critical care time) Labs Review Labs Reviewed  CBC WITH DIFFERENTIAL/PLATELET  URINE RAPID DRUG SCREEN, HOSP PERFORMED  BASIC METABOLIC PANEL  MAGNESIUM  TSH    Imaging Review Dg Chest 2 View  10/14/2014   CLINICAL DATA:  New atrial fibrillation.  EXAM: CHEST  2 VIEW  COMPARISON:  None.  FINDINGS: The cardiomediastinal silhouette is within normal  limits. The lungs are well inflated and clear. There is no evidence of pleural effusion or pneumothorax. No acute osseous abnormality is identified.  IMPRESSION: No active cardiopulmonary disease.   Electronically Signed   By: Sebastian Ache   On: 10/14/2014 10:57     EKG Interpretation   Date/Time:  Thursday October 14 2014 09:22:02  EDT Ventricular Rate:  109 PR Interval:    QRS Duration: 80 QT Interval:  300 QTC Calculation: 404 R Axis:   48 Text Interpretation:  Atrial fibrillation with rapid ventricular response  Abnormal ECG afib is new Confirmed by Rubin Payor  MD, Harrold Donath [phone]) on  10/14/2014 9:53:48 AM      MDM   Final diagnoses:  Atrial fibrillation, unspecified    Patient with new onset atrial fibrillation. Rate controlled with rates in the 70s and 80s most the time the ER. Chadsvasc 0. Labs reassuring. Will have follow-up with the A. fib clinic.   Benjiman Core, MD 10/14/14 1224

## 2014-10-14 NOTE — Discharge Instructions (Signed)

## 2014-10-14 NOTE — ED Notes (Signed)
Pt reports having ekg done at [facility] recently that was abnormal. Pt wants cardiac eval due to palpitations. Denies cp or sob.

## 2014-10-14 NOTE — ED Notes (Signed)
Patient had an EKG done at [facility] yesterday which showed new onset a-fib.  He was told to see a cardiologist within 24 hrs.  States that all of the cardiologists he called told him to go to the ED.

## 2014-10-15 ENCOUNTER — Encounter (HOSPITAL_COMMUNITY): Payer: Self-pay | Admitting: Nurse Practitioner

## 2014-10-15 ENCOUNTER — Other Ambulatory Visit: Payer: Self-pay

## 2014-10-15 ENCOUNTER — Other Ambulatory Visit: Payer: Self-pay | Admitting: *Deleted

## 2014-10-15 ENCOUNTER — Ambulatory Visit (HOSPITAL_COMMUNITY)
Admission: RE | Admit: 2014-10-15 | Discharge: 2014-10-15 | Disposition: A | Discharge status: Home / Self Care | Payer: BLUE CROSS/BLUE SHIELD | Source: Ambulatory Visit | Attending: Nurse Practitioner | Admitting: Nurse Practitioner

## 2014-10-15 VITALS — BP 114/78 | HR 109 | Ht 75.0 in | Wt 229.6 lb

## 2014-10-15 DIAGNOSIS — I480 Paroxysmal atrial fibrillation: Secondary | ICD-10-CM

## 2014-10-15 DIAGNOSIS — G4733 Obstructive sleep apnea (adult) (pediatric): Secondary | ICD-10-CM

## 2014-10-15 DIAGNOSIS — I4891 Unspecified atrial fibrillation: Secondary | ICD-10-CM | POA: Diagnosis not present

## 2014-10-15 DIAGNOSIS — R0683 Snoring: Secondary | ICD-10-CM | POA: Diagnosis not present

## 2014-10-15 MED ORDER — DILTIAZEM HCL ER COATED BEADS 120 MG PO CP24: 120.0000 mg | ORAL_CAPSULE | Freq: Every day | ORAL | Status: DC

## 2014-10-15 NOTE — Patient Instructions (Addendum)
Scheduler will be in touch with you regarding setting up sleep study.   Your physician has recommended you make the following change in your medication:  1)Cardizem  once a day.

## 2014-10-15 NOTE — Progress Notes (Signed)
Patient ID: Austin Golden, male   DOB: 02-04-1971, 44 y.o.   MRN: [id number]    Primary Care Physician: No PCP Per Patient Referring Physician: ER f/u   Austin Golden is a 44 y.o. male healthy without any significant health problems referred from the ER for new onset afib. Pt is involved in a healthy status long term f/u clinical trial at [facility] and when seen there on Monday, he was told that he had afib and to f/u with cardiology. He was asymptomatic. Then he had an episode Wednesday pm when he was aware of rapid heartbeat, chest pressure and fatigue. He went to bed and when he woke up Thursday, felt better but was aware of irregular heart beat so came to the ER to be evaluated. He was rate controlled in the ER and chadsvasc was 0. Labs were unremarkable. He was d/c form the ER to f/u in the Afib clinic today.  EKG shows afib at 104 bpm,  with early repolarization. He reports snoring history with daytime somnolence, Never had a sleep study in the past. No tobacco use, nor alcohol. Was drinking alot of caffeine but recently has cut it out. Does report being a marathon runner when younger but now does not have an exercise program and has gained 30 lbs since his running days. He was addicted to cocaine in his mid 47's but no current drug use. 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   Today, he denies symptoms of  chest pain, shortness of breath, orthopnea, PND, lower extremity edema, dizziness, presyncope, syncope, or neurologic sequela. Aware of papitations.The patient is tolerating medications without difficulties and is otherwise without complaint today.   Past Medical History  Diagnosis Date  . Cocaine addiction   . Substance abuse    No past surgical history on file.  Current Outpatient Prescriptions  Medication Sig Dispense Refill  . diltiazem (CARDIZEM CD) 120 MG 24 hr capsule Take 1 capsule (120 mg total) by mouth daily. 30 capsule 3   No current  facility-administered medications for this encounter.    No Known Allergies  Social History   Social History  . Marital Status: Divorced    Spouse Name: N/A  . Number of Children: N/A  . Years of Education: N/A   Occupational History  . Not on file.   Social History Main Topics  . Smoking status: Never Smoker   . Smokeless tobacco: Not on file  . Alcohol Use: No  . Drug Use: No  . Sexual Activity: No   Other Topics Concern  . Not on file   Social History Narrative    No family history on file.  ROS- All systems are reviewed and negative except as per the HPI above  Physical Exam: Filed Vitals:   10/15/14 0908  BP: 114/78  Pulse: 109  Height:  (1.905 m)  Weight: 229 lb 9.6 oz (104.146 kg)    GEN- The patient is well appearing, alert and oriented x 3 today.   Head- normocephalic, atraumatic Eyes-  Sclera clear, conjunctiva pink Ears- hearing intact Oropharynx- clear Neck- supple, no JVP Lymph- no cervical lymphadenopathy Lungs- Clear to ausculation bilaterally, normal work of breathing Heart- Irregular rate and rhythm, no murmurs, rubs or gallops, PMI not laterally displaced GI- soft, NT, ND, + BS Extremities- no clubbing, cyanosis, or edema MS- no significant deformity or atrophy Skin- no rash or lesion Psych- euthymic mood, full affect Neuro- strength and sensation are  intact  EKG- Afib at 104 bpm, early repolarization, QRS int 90 ms, QTc 394 ms ER records reviewed with normal labs, including TSH   Assessment and Plan:  1. New onset afib Cardizem CD 120 mg a day for rate control for now Surface echo May need stress test when HR is controlled Encouraged to develop a regular exercise program and weight loss advised.  2. Snoring  Sleep study ordered  3. Chadsvasc score of 0 No anticoagulation is needed currently  F/u in afib clinic in two weeks  Austin Golden [facility] [address] [place],  [place] [address] [phone]

## 2014-10-21 ENCOUNTER — Ambulatory Visit (HOSPITAL_COMMUNITY)
Admission: RE | Admit: 2014-10-21 | Discharge: 2014-10-21 | Disposition: A | Discharge status: Home / Self Care | Payer: BLUE CROSS/BLUE SHIELD | Source: Ambulatory Visit | Attending: Nurse Practitioner | Admitting: Nurse Practitioner

## 2014-10-21 DIAGNOSIS — I4891 Unspecified atrial fibrillation: Secondary | ICD-10-CM | POA: Diagnosis present

## 2014-10-21 DIAGNOSIS — I059 Rheumatic mitral valve disease, unspecified: Secondary | ICD-10-CM | POA: Insufficient documentation

## 2014-10-21 DIAGNOSIS — I340 Nonrheumatic mitral (valve) insufficiency: Secondary | ICD-10-CM | POA: Diagnosis not present

## 2014-10-21 DIAGNOSIS — I480 Paroxysmal atrial fibrillation: Secondary | ICD-10-CM

## 2014-10-21 NOTE — Progress Notes (Signed)
  Echocardiogram 2D Echocardiogram has been performed.  Janalyn Harder 10/21/2014, 9:41 AM

## 2014-10-25 ENCOUNTER — Ambulatory Visit (HOSPITAL_COMMUNITY)
Admission: RE | Admit: 2014-10-25 | Discharge: 2014-10-25 | Disposition: A | Discharge status: Home / Self Care | Payer: BLUE CROSS/BLUE SHIELD | Source: Ambulatory Visit | Attending: Nurse Practitioner | Admitting: Nurse Practitioner

## 2014-10-25 ENCOUNTER — Other Ambulatory Visit: Payer: Self-pay

## 2014-10-25 ENCOUNTER — Encounter (HOSPITAL_COMMUNITY): Payer: Self-pay | Admitting: Nurse Practitioner

## 2014-10-25 VITALS — BP 100/68 | HR 99 | Ht 75.0 in | Wt 227.3 lb

## 2014-10-25 DIAGNOSIS — I4891 Unspecified atrial fibrillation: Secondary | ICD-10-CM | POA: Diagnosis not present

## 2014-10-25 DIAGNOSIS — R0683 Snoring: Secondary | ICD-10-CM | POA: Diagnosis not present

## 2014-10-25 DIAGNOSIS — I480 Paroxysmal atrial fibrillation: Secondary | ICD-10-CM | POA: Diagnosis not present

## 2014-10-25 MED ORDER — RIVAROXABAN 20 MG PO TABS: 20.0000 mg | ORAL_TABLET | Freq: Every day | ORAL | Status: AC

## 2014-10-25 NOTE — Progress Notes (Signed)
Patient ID: Austin Golden, male   DOB: 26-Jun-1970, 44 y.o.   MRN: [id number]    Primary Care Physician: No PCP Per Patient Referring Physician: ER f/u   Mckinnon Golden is a 44 y.o. male healthy without any significant health problems referred to the afib clinic from the ER for new onset afib. Pt is involved in a healthy status long term f/u clinical trial at [facility] and when seen there on Monday, he was told that he had afib and to f/u with cardiology. He was asymptomatic. Then he had an episode Wednesday pm when he was aware of rapid heartbeat, chest pressure and fatigue. He went to bed and when he woke up Thursday, felt better but was aware of irregular heart beat so came to the ER to be evaluated. He was rate controlled in the ER and chadsvasc was 0. Labs were unremarkable. He was d/c form the ER to f/u in the Afib clinic today.  EKG shows afib at 104 bpm,  with early repolarization. He reports snoring history with daytime somnolence, Never had a sleep study in the past. No tobacco use, nor alcohol. Was drinking alot of caffeine but recently has cut it out. Does report being a marathon runner when younger but now does not have an exercise program and has gained 30 lbs since his running days. He was addicted to cocaine in his mid 71's but no current drug use. 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   He returns to the afib clinic today after having an echo with a normal structure heart. EKG show persistent afib. Despite being on daily cardizem, he still has a heart rate in the upper 90's. He again is aymptomatic but when he checks his pulse, he knows he is still out because a normal heart rate for him in the 50-60 range. Still he is proving himself to be persistent, discussed starting on a blood thinner so DCCV or chemical conversion can be attempted in 3 weeks.   Today, he denies symptoms of  chest pain, shortness of breath, orthopnea, PND, lower extremity edema,  dizziness, presyncope, syncope, or neurologic sequela. Aware of papitations.The patient is tolerating medications without difficulties and is otherwise without complaint today.   Past Medical History  Diagnosis Date  . Cocaine addiction   . Substance abuse    No past surgical history on file.  Current Outpatient Prescriptions  Medication Sig Dispense Refill  . diltiazem (CARDIZEM CD) 120 MG 24 hr capsule Take 1 capsule (120 mg total) by mouth daily. 30 capsule 3  . rivaroxaban (XARELTO) 20 MG TABS tablet Take 1 tablet (20 mg total) by mouth daily with supper. 30 tablet 6   No current facility-administered medications for this encounter.    No Known Allergies  Social History   Social History  . Marital Status: Divorced    Spouse Name: N/A  . Number of Children: N/A  . Years of Education: N/A   Occupational History  . Not on file.   Social History Main Topics  . Smoking status: Never Smoker   . Smokeless tobacco: Not on file  . Alcohol Use: No  . Drug Use: No  . Sexual Activity: No   Other Topics Concern  . Not on file   Social History Narrative    No family history on file.  ROS- All systems are reviewed and negative except as per the HPI above  Physical Exam: Filed Vitals:   10/25/14 4098  BP: 100/68  Pulse: 99  Height:  (1.905 m)  Weight: 227 lb 4.8 oz (103.103 kg)    GEN- The patient is well appearing, alert and oriented x 3 today.   Head- normocephalic, atraumatic Eyes-  Sclera clear, conjunctiva pink Ears- hearing intact Oropharynx- clear Neck- supple, no JVP Lymph- no cervical lymphadenopathy Lungs- Clear to ausculation bilaterally, normal work of breathing Heart- Irregular rate and rhythm, no murmurs, rubs or gallops, PMI not laterally displaced GI- soft, NT, ND, + BS Extremities- no clubbing, cyanosis, or edema MS- no significant deformity or atrophy Skin- no rash or lesion Psych- euthymic mood, full affect Neuro- strength and  sensation are intact  EKG- Afib at 99 bpm. ER records reviewed with normal labs, including TSH Echo-Left ventricle: The cavity size was normal. Wall thickness was normal. Systolic function was normal. The estimated ejection fraction was in the range of 50% to 55%. Wall motion was normal; there were no regional wall motion abnormalities. Global longitudinal strain: - 15.7% - Mitral valve: Calcified annulus. Mildly thickened leaflets . There was mild regurgitation.   Assessment and Plan:  1. New onset afib Cardizem CD 120 mg a day for rate control for now, can not increase the dose due to soft  BP at 100 systolic May need stress test when HR is controlled, currently no angina symptoms Encouraged to develop a regular exercise program and weight loss advised.  2. Snoring  Sleep study pending  3. Chadsvasc score of 0 Will start anticoagulation due to afib appearing persistent and may need DCCV or chemical conversion. All available anticoagulants explained, risk vrs benefit and pt would like to go with xarelto 20 mg daily at dinner. Denies a bleeding history.   F/u in afib clinic in two weeks  Lupita Leash C[facility] [address] [place], [place] [address] [phone]

## 2014-10-25 NOTE — Patient Instructions (Signed)
Your physician has recommended you make the following change in your medication:  1)Take Xarelto 20 mg daily with supper. 2) Follow up in two weeks.

## 2014-10-29 ENCOUNTER — Inpatient Hospital Stay (HOSPITAL_COMMUNITY): Admission: RE | Admit: 2014-10-29 | Payer: Self-pay | Source: Ambulatory Visit | Admitting: Nurse Practitioner

## 2014-11-10 ENCOUNTER — Ambulatory Visit (HOSPITAL_COMMUNITY)
Admission: RE | Admit: 2014-11-10 | Discharge: 2014-11-10 | Disposition: A | Discharge status: Home / Self Care | Payer: BLUE CROSS/BLUE SHIELD | Source: Ambulatory Visit | Attending: Nurse Practitioner | Admitting: Nurse Practitioner

## 2014-11-10 VITALS — BP 106/80 | HR 89 | Ht 75.0 in | Wt 226.6 lb

## 2014-11-10 DIAGNOSIS — R0683 Snoring: Secondary | ICD-10-CM | POA: Insufficient documentation

## 2014-11-10 DIAGNOSIS — I4819 Other persistent atrial fibrillation: Secondary | ICD-10-CM

## 2014-11-10 DIAGNOSIS — I481 Persistent atrial fibrillation: Secondary | ICD-10-CM | POA: Diagnosis not present

## 2014-11-10 DIAGNOSIS — I4891 Unspecified atrial fibrillation: Secondary | ICD-10-CM | POA: Insufficient documentation

## 2014-11-10 LAB — CBC
HEMATOCRIT: 42.8 % (ref 39.0–52.0)
Hemoglobin: 14.9 g/dL (ref 13.0–17.0)
MCH: 30.4 pg (ref 26.0–34.0)
MCHC: 34.8 g/dL (ref 30.0–36.0)
MCV: 87.3 fL (ref 78.0–100.0)
Platelets: 206 10*3/uL (ref 150–400)
RBC: 4.9 MIL/uL (ref 4.22–5.81)
RDW: 12.7 % (ref 11.5–15.5)
WBC: 5.5 10*3/uL (ref 4.0–10.5)

## 2014-11-10 LAB — BASIC METABOLIC PANEL
ANION GAP: 6 (ref 5–15)
BUN: 13 mg/dL (ref 6–20)
CHLORIDE: 102 mmol/L (ref 101–111)
CO2: 30 mmol/L (ref 22–32)
Calcium: 9.4 mg/dL (ref 8.9–10.3)
Creatinine, Ser: 1.02 mg/dL (ref 0.61–1.24)
GFR calc Af Amer: >60 mL/min (ref >60)
GFR calc non Af Amer: >60 mL/min (ref >60)
GLUCOSE: 78 mg/dL (ref 65–99)
POTASSIUM: 4.5 mmol/L (ref 3.5–5.1)
Sodium: 138 mmol/L (ref 135–145)

## 2014-11-10 MED ORDER — DILTIAZEM HCL ER COATED BEADS 120 MG PO CP24: 120.0000 mg | ORAL_CAPSULE | Freq: Every day | ORAL | Status: AC

## 2014-11-10 NOTE — Patient Instructions (Signed)
Cardioversion scheduled for Thursday, September 15th  - Arrive at the [facility] and go to admitting at 7am  -Do not eat or drink anything after midnight the night prior to your procedure.  - Take all your medication with a sip of water prior to arrival.  - You will not be able to drive home after your procedure.  Parking code [phone]

## 2014-11-10 NOTE — Progress Notes (Signed)
Patient ID: Austin Golden, male   DOB: Nov 20, 1970, 44 y.o.   MRN: [id number]       Primary Care Physician: No PCP Per Patient Referring Physician: ER f/u   Austin Golden is a 44 y.o. male healthy without any significant health problems referred to the afib clinic from the ER for new onset afib. Pt is involved in a healthy status long term f/u clinical trial at [facility] and when seen there on Monday, he was told that he had afib and to f/u with cardiology. He was asymptomatic. Then he had an episode Wednesday pm when he was aware of rapid heartbeat, chest pressure and fatigue. He went to bed and when he woke up Thursday, felt better but was aware of irregular heart beat so came to the ER to be evaluated. He was rate controlled in the ER and chadsvasc was 0. Labs were unremarkable. He was d/c form the ER to f/u in the Afib clinic today.  EKG shows afib at 104 bpm,  with early repolarization. He reports snoring history with daytime somnolence, Never had a sleep study in the past. No tobacco use, nor alcohol. Was drinking alot of caffeine but recently has cut it out. Does report being a marathon runner when younger but now does not have an exercise program and has gained 30 lbs since his running days. He was addicted to cocaine in his mid 73's but no current drug use. 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   He returns to the afib clinic today after staring on DOAC to prepare for cardioversion. Chadsvasc score of 0. He has been on now for couple of weeks. Will be set up for DCCV when he has reached using drug x 3 weeks. He has not missed doses. Not aware of afib. It is rate controlled  with cardizem.He is pending a sleep study in October for snoring with daytime somnolence. DCCV procedure discussed with pt, risks vrs benefit and he would like to proceed.  Today, he denies symptoms of  chest pain, shortness of breath, orthopnea, PND, lower extremity edema, dizziness,  presyncope, syncope, or neurologic sequela. Aware of papitations.The patient is tolerating medications without difficulties and is otherwise without complaint today.   Past Medical History  Diagnosis Date  . Cocaine addiction   . Substance abuse    No past surgical history on file.  Current Outpatient Prescriptions  Medication Sig Dispense Refill  . diltiazem (CARDIZEM CD) 120 MG 24 hr capsule Take 1 capsule (120 mg total) by mouth daily. 30 capsule 6  . rivaroxaban (XARELTO) 20 MG TABS tablet Take 1 tablet (20 mg total) by mouth daily with supper. 30 tablet 6   No current facility-administered medications for this encounter.    No Known Allergies  Social History   Social History  . Marital Status: Divorced    Spouse Name: N/A  . Number of Children: N/A  . Years of Education: N/A   Occupational History  . Not on file.   Social History Main Topics  . Smoking status: Never Smoker   . Smokeless tobacco: Not on file  . Alcohol Use: No  . Drug Use: No  . Sexual Activity: No   Other Topics Concern  . Not on file   Social History Narrative    No family history on file.  ROS- All systems are reviewed and negative except as per the HPI above  Physical Exam: Filed Vitals:   11/10/14 0945  BP: 106/80  Pulse: 89  Height: 6\' 3"  (1.905 m)  Weight: 226 lb 9.6 oz (102.785 kg)    GEN- The patient is well appearing, alert and oriented x 3 today.   Head- normocephalic, atraumatic Eyes-  Sclera clear, conjunctiva pink Ears- hearing intact Oropharynx- clear Neck- supple, no JVP Lymph- no cervical lymphadenopathy Lungs- Clear to ausculation bilaterally, normal work of breathing Heart- Irregular rate and rhythm, no murmurs, rubs or gallops, PMI not laterally displaced GI- soft, NT, ND, + BS Extremities- no clubbing, cyanosis, or edema MS- no significant deformity or atrophy Skin- no rash or lesion Psych- euthymic mood, full affect Neuro- strength and sensation are  intact  EKG- Afib at 89 bpm. ER records reviewed with normal labs, including TSH Echo-Left ventricle: The cavity size was normal. Wall thickness was normal. Systolic function was normal. The estimated ejection fraction was in the range of 50% to 55%. Wall motion was normal; there were no regional wall motion abnormalities. Global longitudinal strain: - 15.7% - Mitral valve: Calcified annulus. Mildly thickened leaflets . There was mild regurgitation.   Assessment and Plan:  1. New onset afib Continue Cardizem CD 120 mg a day for rate control, can not increase the dose due to soft  BP at 100 systolic May need stress test when HR is controlled, currently no angina symptoms Encouraged to develop a regular exercise program and weight loss advised.  2. Snoring  Sleep study pending  3. Chadsvasc score of 0 On DOAC for pending cardioversion, scheduled DCCV for 9/15. Reminded to not miss any doses. Bmet/cbc today  F/u in afib clinic one week post cardioversion.  Elvina Sidle [facility] [address] [place], [place] [address] [phone]

## 2014-11-17 NOTE — Anesthesia Preprocedure Evaluation (Addendum)
Anesthesia Evaluation  Patient identified by MRN, date of birth, ID band Patient awake    Reviewed: Allergy & Precautions, H&P , NPO status , Patient's Chart, lab work & pertinent test results  Airway Mallampati: II  TM Distance: <3 FB Neck ROM: Full    Dental no notable dental hx. (+) Dental Advidsory Given, Teeth Intact   Pulmonary neg pulmonary ROS,    Pulmonary exam normal breath sounds clear to auscultation       Cardiovascular negative cardio ROS Normal cardiovascular exam Rhythm:Irregular Rate:Normal     Neuro/Psych negative neurological ROS  negative psych ROS   GI/Hepatic negative GI ROS, Neg liver ROS,   Endo/Other  negative endocrine ROS  Renal/GU negative Renal ROS     Musculoskeletal negative musculoskeletal ROS (+)   Abdominal (+) - obese,   Peds  Hematology negative hematology ROS (+)   Anesthesia Other Findings   Reproductive/Obstetrics negative OB ROS                          Anesthesia Physical Anesthesia Plan  ASA: III  Anesthesia Plan: General   Post-op Pain Management:    Induction: Intravenous  Airway Management Planned: Mask  Additional Equipment:   Intra-op Plan:   Post-operative Plan:   Informed Consent: I have reviewed the patients History and Physical, chart, labs and discussed the procedure including the risks, benefits and alternatives for the proposed anesthesia with the patient or authorized representative who has indicated his/her understanding and acceptance.   Dental advisory given and Dental Advisory Given  Plan Discussed with: CRNA  Anesthesia Plan Comments:        Anesthesia Quick Evaluation

## 2014-11-18 ENCOUNTER — Ambulatory Visit (HOSPITAL_COMMUNITY)
Admission: RE | Admit: 2014-11-18 | Discharge: 2014-11-18 | Disposition: A | Discharge status: Home / Self Care | Payer: BLUE CROSS/BLUE SHIELD | Source: Ambulatory Visit | Attending: Cardiology | Admitting: Cardiology

## 2014-11-18 ENCOUNTER — Encounter (HOSPITAL_COMMUNITY)
Admission: RE | Disposition: A | Discharge status: Home / Self Care | Payer: Self-pay | Source: Ambulatory Visit | Attending: Cardiology

## 2014-11-18 ENCOUNTER — Ambulatory Visit (HOSPITAL_COMMUNITY): Payer: BLUE CROSS/BLUE SHIELD | Admitting: Anesthesiology

## 2014-11-18 ENCOUNTER — Encounter (HOSPITAL_COMMUNITY): Payer: Self-pay | Admitting: *Deleted

## 2014-11-18 DIAGNOSIS — I4891 Unspecified atrial fibrillation: Secondary | ICD-10-CM | POA: Diagnosis not present

## 2014-11-18 DIAGNOSIS — Z7901 Long term (current) use of anticoagulants: Secondary | ICD-10-CM | POA: Diagnosis not present

## 2014-11-18 DIAGNOSIS — I481 Persistent atrial fibrillation: Secondary | ICD-10-CM

## 2014-11-18 DIAGNOSIS — E669 Obesity, unspecified: Secondary | ICD-10-CM | POA: Insufficient documentation

## 2014-11-18 HISTORY — DX: Unspecified atrial fibrillation: I48.91

## 2014-11-18 HISTORY — PX: CARDIOVERSION: SHX1299

## 2014-11-18 SURGERY — CARDIOVERSION
Anesthesia: General

## 2014-11-18 MED ORDER — SODIUM CHLORIDE 0.9 % IV SOLN
INTRAVENOUS | Status: DC
  Administered 2014-11-18: 500 mL via INTRAVENOUS

## 2014-11-18 MED ORDER — PROPOFOL 10 MG/ML IV BOLUS
INTRAVENOUS | Status: DC | PRN
  Administered 2014-11-18: 20 mg via INTRAVENOUS
  Administered 2014-11-18: 30 mg via INTRAVENOUS
  Administered 2014-11-18: 80 mg via INTRAVENOUS

## 2014-11-18 MED ORDER — LIDOCAINE HCL 1 % IJ SOLN
INTRAMUSCULAR | Status: DC | PRN
Start: 2014-11-18 — End: 2014-11-18
  Administered 2014-11-18: 100 mg via INTRADERMAL

## 2014-11-18 NOTE — CV Procedure (Signed)
    Electrical Cardioversion Procedure Note Austin Golden [id number] April 01, 1970  Procedure: Electrical Cardioversion Indications:  Atrial Fibrillation  Time Out: Verified patient identification, verified procedure,medications/allergies/relevent history reviewed, required imaging and test results available.  Performed  Procedure Details  The patient was NPO after midnight. Anesthesia was administered at the beside  by anesthesia with propofol.  Cardioversion was performed with synchronized biphasic defibrillation via AP pads with 120, 200, 200 joules.  3 attempt(s) were performed.  The patient DID NOT CONVERT. The patient tolerated the procedure well   IMPRESSION:  Unsuccessful cardioversion. Patient remains in AFIB    Austin Golden 11/18/2014, 8:12 AM

## 2014-11-18 NOTE — Anesthesia Postprocedure Evaluation (Signed)
  Anesthesia Post-op Note  Patient: Austin Golden  Procedure(s) Performed: Procedure(s): CARDIOVERSION (N/A)  Patient Location: Endoscopy Unit  Anesthesia Type:MAC  Level of Consciousness: awake, alert  and oriented  Airway and Oxygen Therapy: Patient Spontanous Breathing  Post-op Pain: mild  Post-op Assessment: Post-op Vital signs reviewed, Patient's Cardiovascular Status Stable, Respiratory Function Stable, Patent Airway, No signs of Nausea or vomiting, Adequate PO intake and Pain level controlled              Post-op Vital Signs: Reviewed and stable  Last Vitals:  Filed Vitals:   11/18/14 0813  BP:   Temp: 36.5 C  Resp:     Complications: No apparent anesthesia complications

## 2014-11-18 NOTE — Transfer of Care (Signed)
Immediate Anesthesia Transfer of Care Note  Patient: Austin Golden  Procedure(s) Performed: Procedure(s): CARDIOVERSION (N/A)  Patient Location: Endoscopy Unit  Anesthesia Type:MAC  Level of Consciousness: awake, alert  and oriented  Airway & Oxygen Therapy: Patient Spontanous Breathing and Patient connected to nasal cannula oxygen  Post-op Assessment: Report given to RN, Post -op Vital signs reviewed and stable and Patient moving all extremities X 4  Post vital signs: Reviewed and stable  Last Vitals:  Filed Vitals:   11/18/14 0718  BP: 105/63  Temp: 36.6 C  Resp: 12    Complications: No apparent anesthesia complications

## 2014-11-18 NOTE — Discharge Instructions (Signed)

## 2014-11-18 NOTE — Interval H&P Note (Signed)
History and Physical Interval Note:  11/18/2014 8:04 AM  Austin Golden  has presented today for surgery, with the diagnosis of afib  The various methods of treatment have been discussed with the patient and family. After consideration of risks, benefits and other options for treatment, the patient has consented to  Procedure(s): CARDIOVERSION (N/A) as a surgical intervention .  The patient's history has been reviewed, patient examined, no change in status, stable for surgery.  I have reviewed the patient's chart and labs.  Questions were answered to the patient's satisfaction.     [name]

## 2014-11-18 NOTE — H&P (View-Only) (Signed)
Patient ID: Austin Golden, male   DOB: Nov 20, 1970, 44 y.o.   MRN: [id number]       Primary Care Physician: No PCP Per Patient Referring Physician: ER f/u   Austin Golden is a 44 y.o. male healthy without any significant health problems referred to the afib clinic from the ER for new onset afib. Pt is involved in a healthy status long term f/u clinical trial at [facility] and when seen there on Monday, he was told that he had afib and to f/u with cardiology. He was asymptomatic. Then he had an episode Wednesday pm when he was aware of rapid heartbeat, chest pressure and fatigue. He went to bed and when he woke up Thursday, felt better but was aware of irregular heart beat so came to the ER to be evaluated. He was rate controlled in the ER and chadsvasc was 0. Labs were unremarkable. He was d/c form the ER to f/u in the Afib clinic today.  EKG shows afib at 104 bpm,  with early repolarization. He reports snoring history with daytime somnolence, Never had a sleep study in the past. No tobacco use, nor alcohol. Was drinking alot of caffeine but recently has cut it out. Does report being a marathon runner when younger but now does not have an exercise program and has gained 30 lbs since his running days. He was addicted to cocaine in his mid 73's but no current drug use. 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   He returns to the afib clinic today after staring on DOAC to prepare for cardioversion. Chadsvasc score of 0. He has been on now for couple of weeks. Will be set up for DCCV when he has reached using drug x 3 weeks. He has not missed doses. Not aware of afib. It is rate controlled  with cardizem.He is pending a sleep study in October for snoring with daytime somnolence. DCCV procedure discussed with pt, risks vrs benefit and he would like to proceed.  Today, he denies symptoms of  chest pain, shortness of breath, orthopnea, PND, lower extremity edema, dizziness,  presyncope, syncope, or neurologic sequela. Aware of papitations.The patient is tolerating medications without difficulties and is otherwise without complaint today.   Past Medical History  Diagnosis Date  . Cocaine addiction   . Substance abuse    No past surgical history on file.  Current Outpatient Prescriptions  Medication Sig Dispense Refill  . diltiazem (CARDIZEM CD) 120 MG 24 hr capsule Take 1 capsule (120 mg total) by mouth daily. 30 capsule 6  . rivaroxaban (XARELTO) 20 MG TABS tablet Take 1 tablet (20 mg total) by mouth daily with supper. 30 tablet 6   No current facility-administered medications for this encounter.    No Known Allergies  Social History   Social History  . Marital Status: Divorced    Spouse Name: N/A  . Number of Children: N/A  . Years of Education: N/A   Occupational History  . Not on file.   Social History Main Topics  . Smoking status: Never Smoker   . Smokeless tobacco: Not on file  . Alcohol Use: No  . Drug Use: No  . Sexual Activity: No   Other Topics Concern  . Not on file   Social History Narrative    No family history on file.  ROS- All systems are reviewed and negative except as per the HPI above  Physical Exam: Filed Vitals:   11/10/14 0945  BP: 106/80  Pulse: 89  Height: 6\' 3"  (1.905 m)  Weight: 226 lb 9.6 oz (102.785 kg)    GEN- The patient is well appearing, alert and oriented x 3 today.   Head- normocephalic, atraumatic Eyes-  Sclera clear, conjunctiva pink Ears- hearing intact Oropharynx- clear Neck- supple, no JVP Lymph- no cervical lymphadenopathy Lungs- Clear to ausculation bilaterally, normal work of breathing Heart- Irregular rate and rhythm, no murmurs, rubs or gallops, PMI not laterally displaced GI- soft, NT, ND, + BS Extremities- no clubbing, cyanosis, or edema MS- no significant deformity or atrophy Skin- no rash or lesion Psych- euthymic mood, full affect Neuro- strength and sensation are  intact  EKG- Afib at 89 bpm. ER records reviewed with normal labs, including TSH Echo-Left ventricle: The cavity size was normal. Wall thickness was normal. Systolic function was normal. The estimated ejection fraction was in the range of 50% to 55%. Wall motion was normal; there were no regional wall motion abnormalities. Global longitudinal strain: - 15.7% - Mitral valve: Calcified annulus. Mildly thickened leaflets . There was mild regurgitation.   Assessment and Plan:  1. New onset afib Continue Cardizem CD 120 mg a day for rate control, can not increase the dose due to soft  BP at 100 systolic May need stress test when HR is controlled, currently no angina symptoms Encouraged to develop a regular exercise program and weight loss advised.  2. Snoring  Sleep study pending  3. Chadsvasc score of 0 On DOAC for pending cardioversion, scheduled DCCV for 9/15. Reminded to not miss any doses. Bmet/cbc today  F/u in afib clinic one week post cardioversion.  Elvina Sidle [facility] [address] [place], [place] [address] [phone]

## 2014-11-19 ENCOUNTER — Encounter (HOSPITAL_COMMUNITY): Payer: Self-pay | Admitting: Cardiology

## 2014-11-25 ENCOUNTER — Inpatient Hospital Stay (HOSPITAL_COMMUNITY): Admission: RE | Admit: 2014-11-25 | Payer: Self-pay | Source: Ambulatory Visit | Admitting: Nurse Practitioner

## 2014-12-08 ENCOUNTER — Ambulatory Visit (INDEPENDENT_AMBULATORY_CARE_PROVIDER_SITE_OTHER): Payer: BLUE CROSS/BLUE SHIELD | Admitting: Internal Medicine

## 2014-12-08 ENCOUNTER — Encounter: Payer: Self-pay | Admitting: Internal Medicine

## 2014-12-08 VITALS — BP 112/80 | HR 70 | Ht 75.0 in | Wt 224.8 lb

## 2014-12-08 DIAGNOSIS — I481 Persistent atrial fibrillation: Secondary | ICD-10-CM

## 2014-12-08 DIAGNOSIS — I480 Paroxysmal atrial fibrillation: Secondary | ICD-10-CM | POA: Diagnosis not present

## 2014-12-08 DIAGNOSIS — I4819 Other persistent atrial fibrillation: Secondary | ICD-10-CM

## 2014-12-08 LAB — CBC WITH DIFFERENTIAL/PLATELET
BASOS ABS: 0.1 10*3/uL (ref 0.0–0.1)
BASOS PCT: 1 % (ref 0–1)
Eosinophils Absolute: 0.1 10*3/uL (ref 0.0–0.7)
Eosinophils Relative: 2 % (ref 0–5)
HEMATOCRIT: 39.2 % (ref 39.0–52.0)
HEMOGLOBIN: 13.9 g/dL (ref 13.0–17.0)
LYMPHS PCT: 35 % (ref 12–46)
Lymphs Abs: 1.8 10*3/uL (ref 0.7–4.0)
MCH: 30 pg (ref 26.0–34.0)
MCHC: 35.5 g/dL (ref 30.0–36.0)
MCV: 84.7 fL (ref 78.0–100.0)
MPV: 9.9 fL (ref 8.6–12.4)
Monocytes Absolute: 0.6 10*3/uL (ref 0.1–1.0)
Monocytes Relative: 11 % (ref 3–12)
NEUTROS ABS: 2.6 10*3/uL (ref 1.7–7.7)
NEUTROS PCT: 51 % (ref 43–77)
Platelets: 264 10*3/uL (ref 150–400)
RBC: 4.63 MIL/uL (ref 4.22–5.81)
RDW: 13.1 % (ref 11.5–15.5)
WBC: 5.1 10*3/uL (ref 4.0–10.5)

## 2014-12-08 LAB — BASIC METABOLIC PANEL
BUN: 13 mg/dL (ref 7–25)
CALCIUM: 9 mg/dL (ref 8.6–10.3)
CO2: 27 mmol/L (ref 20–31)
Chloride: 104 mmol/L (ref 98–110)
Creat: 1.16 mg/dL (ref 0.60–1.35)
Glucose, Bld: 103 mg/dL — ABNORMAL HIGH (ref 65–99)
POTASSIUM: 4.4 mmol/L (ref 3.5–5.3)
SODIUM: 138 mmol/L (ref 135–146)

## 2014-12-08 NOTE — Progress Notes (Signed)
ELECTROPHYSIOLOGY CONSULT NOTE  Patient ID: Austin SerBradley Rosenwald, MRN: [id number], DOB/AGE: 1970-12-02 44 y.o. Admit date: (Not on file) Date of Consult: 12/08/2014  Primary Physician: No PCP Per Patient Primary Cardiologist: new  Chief Complaint: Atrial fib   HPI Austin Golden is a 44 y.o. male   referred from the A. 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   he has been largely asymptomatic. He is unaware of the arrhythmia that he has noted dyspnea on exertion when he plays basketball.  He was started on Rivaroxaban and subjected to cardioversion but could not be cardioverted   Echo eval Normal LV funciton and normal LA size       Past Medical History  Diagnosis Date  . Cocaine addiction (HCC)   . Substance abuse   . A-fib (HCC) 11/18/2014      Surgical History:  Past Surgical History  Procedure Laterality Date  . Cardioversion N/A 11/18/2014    Procedure: CARDIOVERSION;  Surgeon: Jake BatheMark C Skains, MD;  [facility];  Service: Cardiovascular;  Laterality: N/A;     Home Meds: Prior to Admission medications   Medication Sig Start Date End Date Taking? Authorizing Provider  diltiazem (CARDIZEM CD) 120 MG 24 hr capsule Take 1 capsule (120 mg total) by mouth daily. 11/10/14  Yes Newman Niponna C Carroll, NP  rivaroxaban (XARELTO) 20 MG TABS tablet Take 1 tablet (20 mg total) by mouth daily with supper. 10/25/14  Yes Newman Niponna C Carroll, NP    Allergies: No Known Allergies  Social History   Social History  . Marital Status: Divorced    Spouse Name: N/A  . Number of Children: N/A  . Years of Education: N/A   Occupational History  . Not on file.   Social History Main Topics  . Smoking status: Never Smoker   . Smokeless tobacco: Not on file  . Alcohol Use: No  . Drug Use: No  . Sexual Activity: No   Other Topics Concern  . Not on file   Social History Narrative     No family  history on file.   ROS:  Please see the history of present illness.     All other systems reviewed and negative.    Physical Exam: Blood pressure 112/80, pulse 70, height 6\' 3"  (1.905 m), weight 224 lb 12.8 oz (101.969 kg). General: Well developed, well nourished male in no acute distress. Head: Normocephalic, atraumatic, sclera non-icteric, no xanthomas, nares are without discharge. EENT: normal  Lymph Nodes:  none Neck: Negative for carotid bruits. JVD not elevated. Back:without scoliosis kyphosis Lungs: Clear bilaterally to auscultation without wheezes, rales, or rhonchi. Breathing is unlabored. Heart: irregularly irregular rate and rhythm  without murmur . No rubs, or gallops appreciated. Abdomen: Soft, non-tender, non-distended with normoactive bowel sounds. No hepatomegaly. No rebound/guarding. No obvious abdominal masses. Msk:  Strength and tone appear normal for age. Extremities: No clubbing or cyanosis. No edema.  Distal pedal pulses are 2+ and equal bilaterally. Skin: Warm and Dry Neuro: Alert and oriented X 3. CN III-XII intact Grossly normal sensory and motor function . Psych:  Responds to questions appropriately with a normal affect.      Labs: Cardiac Enzymes No results for input(s): CKTOTAL, CKMB, TROPONINI in the last 72 hours. CBC Lab Results  Component Value Date   WBC 5.5 11/10/2014   HGB 14.9 11/10/2014   HCT 42.8 11/10/2014   MCV 87.3 11/10/2014  PLT 206 11/10/2014   PROTIME: No results for input(s): LABPROT, INR in the last 72 hours. Chemistry No results for input(s): NA, K, CL, CO2, BUN, CREATININE, CALCIUM, PROT, BILITOT, ALKPHOS, ALT, AST, GLUCOSE in the last 168 hours.  Invalid input(s): LABALBU Lipids Lab Results  Component Value Date   CHOL 183 12/30/2013   HDL 53 12/30/2013   LDLCALC 107* 12/30/2013   TRIG 117 12/30/2013   BNP No results found for: PROBNP Thyroid Function Tests: No results for input(s): TSH, T4TOTAL, T3FREE, THYROIDAB  in the last 72 hours.  Invalid input(s): FREET3 Miscellaneous No results found for: DDIMER  Radiology/Studies:  No results found.  EKG:   Atrial fibrillation with a slow ventricular response    Assessment and Plan:   atrial fibrillation-persistent    Given his young age and is normal left atrial size, I would pursue repeat efforts to cardioversion. A couple of options present themselves, the first using double defibrillators if pad placement isn't sufficient, and if that were to fail to use adjunctive dofetilide.  Notably potassium was normal one month ago but 8 months ago was 3.3  This will be rechecked prior to cardioversion.     Sherryl Manges

## 2014-12-08 NOTE — Patient Instructions (Signed)
Medication Instructions: - no changes  Labwork: - Your physician recommends that you have lab work today: BMP/ CBC  Procedures/Testing: - Your physician has recommended that you have a Cardioversion (DCCV). Electrical Cardioversion uses a jolt of electricity to your heart either through paddles or wired patches attached to your chest. This is a controlled, usually prescheduled, procedure. Defibrillation is done under light anesthesia in the hospital, and you usually go home the day of the procedure. This is done to get your heart back into a normal rhythm. You are not awake for the procedure. Herbert Seta will call you to schedule this for next week.  Follow-Up: - to be determined post cardioversion  Any Additional Special Instructions Will Be Listed Below (If Applicable). - none

## 2014-12-10 ENCOUNTER — Encounter: Payer: Self-pay | Admitting: *Deleted

## 2014-12-10 ENCOUNTER — Telehealth: Payer: Self-pay | Admitting: Internal Medicine

## 2014-12-10 NOTE — Telephone Encounter (Signed)
I spoke with the patient and he is aware of his cardioversion date / time. He is agreeable with this.

## 2014-12-10 NOTE — Telephone Encounter (Signed)
Cardioversion scheduled for 12/15/14 (per Dr. Odessa Fleming request). Arrive at 8:30 am for a 10:00 am procedure. I left a message for the patient to call.

## 2014-12-11 ENCOUNTER — Other Ambulatory Visit: Payer: Self-pay | Admitting: Internal Medicine

## 2014-12-11 DIAGNOSIS — I4819 Other persistent atrial fibrillation: Secondary | ICD-10-CM

## 2014-12-14 MED ORDER — SODIUM CHLORIDE 0.9 % IV SOLN
INTRAVENOUS | Status: DC
  Administered 2014-12-15: 09:00:00 via INTRAVENOUS

## 2014-12-15 ENCOUNTER — Encounter (HOSPITAL_COMMUNITY)
Admission: RE | Disposition: A | Discharge status: Home / Self Care | Payer: Self-pay | Source: Ambulatory Visit | Attending: Cardiovascular Disease

## 2014-12-15 ENCOUNTER — Ambulatory Visit (HOSPITAL_COMMUNITY): Payer: BLUE CROSS/BLUE SHIELD | Admitting: Anesthesiology

## 2014-12-15 ENCOUNTER — Ambulatory Visit (HOSPITAL_COMMUNITY)
Admission: RE | Admit: 2014-12-15 | Discharge: 2014-12-15 | Disposition: A | Discharge status: Home / Self Care | Payer: BLUE CROSS/BLUE SHIELD | Source: Ambulatory Visit | Attending: Cardiovascular Disease | Admitting: Cardiovascular Disease

## 2014-12-15 ENCOUNTER — Encounter (HOSPITAL_COMMUNITY): Payer: Self-pay | Admitting: Anesthesiology

## 2014-12-15 DIAGNOSIS — I4819 Other persistent atrial fibrillation: Secondary | ICD-10-CM

## 2014-12-15 DIAGNOSIS — I481 Persistent atrial fibrillation: Secondary | ICD-10-CM | POA: Insufficient documentation

## 2014-12-15 DIAGNOSIS — Z7901 Long term (current) use of anticoagulants: Secondary | ICD-10-CM | POA: Diagnosis not present

## 2014-12-15 DIAGNOSIS — I4891 Unspecified atrial fibrillation: Secondary | ICD-10-CM | POA: Diagnosis not present

## 2014-12-15 HISTORY — PX: CARDIOVERSION: SHX1299

## 2014-12-15 SURGERY — CARDIOVERSION
Anesthesia: Monitor Anesthesia Care

## 2014-12-15 MED ORDER — PROPOFOL 10 MG/ML IV BOLUS
INTRAVENOUS | Status: DC | PRN
  Administered 2014-12-15: 100 mg via INTRAVENOUS

## 2014-12-15 NOTE — CV Procedure (Signed)
P2reop Dx   afib Post op DX  NSR   Procedure  DC Cardioversion   Pt was sedated by anesthesia receiving 100 mg Propafol  A synchronized shock 200 joules restored sinus Rhythm   Pt tolerated without difficulty  2

## 2014-12-15 NOTE — H&P (View-Only) (Signed)
ELECTROPHYSIOLOGY CONSULT NOTE  Patient ID: Austin Golden, MRN: [id number], DOB/AGE: 1970-12-02 44 y.o. Admit date: (Not on file) Date of Consult: 12/08/2014  Primary Physician: No PCP Per Patient Primary Cardiologist: new  Chief Complaint: Atrial fib   HPI Austin Golden is a 44 y.o. male   referred from the A. 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   he has been largely asymptomatic. He is unaware of the arrhythmia that he has noted dyspnea on exertion when he plays basketball.  He was started on Rivaroxaban and subjected to cardioversion but could not be cardioverted   Echo eval Normal LV funciton and normal LA size       Past Medical History  Diagnosis Date  . Cocaine addiction (HCC)   . Substance abuse   . A-fib (HCC) 11/18/2014      Surgical History:  Past Surgical History  Procedure Laterality Date  . Cardioversion N/A 11/18/2014    Procedure: CARDIOVERSION;  Surgeon: Jake BatheMark C Skains, MD;  [facility];  Service: Cardiovascular;  Laterality: N/A;     Home Meds: Prior to Admission medications   Medication Sig Start Date End Date Taking? Authorizing Provider  diltiazem (CARDIZEM CD) 120 MG 24 hr capsule Take 1 capsule (120 mg total) by mouth daily. 11/10/14  Yes Newman Niponna C Carroll, NP  rivaroxaban (XARELTO) 20 MG TABS tablet Take 1 tablet (20 mg total) by mouth daily with supper. 10/25/14  Yes Newman Niponna C Carroll, NP    Allergies: No Known Allergies  Social History   Social History  . Marital Status: Divorced    Spouse Name: N/A  . Number of Children: N/A  . Years of Education: N/A   Occupational History  . Not on file.   Social History Main Topics  . Smoking status: Never Smoker   . Smokeless tobacco: Not on file  . Alcohol Use: No  . Drug Use: No  . Sexual Activity: No   Other Topics Concern  . Not on file   Social History Narrative     No family  history on file.   ROS:  Please see the history of present illness.     All other systems reviewed and negative.    Physical Exam: Blood pressure 112/80, pulse 70, height 6\' 3"  (1.905 m), weight 224 lb 12.8 oz (101.969 kg). General: Well developed, well nourished male in no acute distress. Head: Normocephalic, atraumatic, sclera non-icteric, no xanthomas, nares are without discharge. EENT: normal  Lymph Nodes:  none Neck: Negative for carotid bruits. JVD not elevated. Back:without scoliosis kyphosis Lungs: Clear bilaterally to auscultation without wheezes, rales, or rhonchi. Breathing is unlabored. Heart: irregularly irregular rate and rhythm  without murmur . No rubs, or gallops appreciated. Abdomen: Soft, non-tender, non-distended with normoactive bowel sounds. No hepatomegaly. No rebound/guarding. No obvious abdominal masses. Msk:  Strength and tone appear normal for age. Extremities: No clubbing or cyanosis. No edema.  Distal pedal pulses are 2+ and equal bilaterally. Skin: Warm and Dry Neuro: Alert and oriented X 3. CN III-XII intact Grossly normal sensory and motor function . Psych:  Responds to questions appropriately with a normal affect.      Labs: Cardiac Enzymes No results for input(s): CKTOTAL, CKMB, TROPONINI in the last 72 hours. CBC Lab Results  Component Value Date   WBC 5.5 11/10/2014   HGB 14.9 11/10/2014   HCT 42.8 11/10/2014   MCV 87.3 11/10/2014  PLT 206 11/10/2014   PROTIME: No results for input(s): LABPROT, INR in the last 72 hours. Chemistry No results for input(s): NA, K, CL, CO2, BUN, CREATININE, CALCIUM, PROT, BILITOT, ALKPHOS, ALT, AST, GLUCOSE in the last 168 hours.  Invalid input(s): LABALBU Lipids Lab Results  Component Value Date   CHOL 183 12/30/2013   HDL 53 12/30/2013   LDLCALC 107* 12/30/2013   TRIG 117 12/30/2013   BNP No results found for: PROBNP Thyroid Function Tests: No results for input(s): TSH, T4TOTAL, T3FREE, THYROIDAB  in the last 72 hours.  Invalid input(s): FREET3 Miscellaneous No results found for: DDIMER  Radiology/Studies:  No results found.  EKG:   Atrial fibrillation with a slow ventricular response    Assessment and Plan:   atrial fibrillation-persistent    Given his young age and is normal left atrial size, I would pursue repeat efforts to cardioversion. A couple of options present themselves, the first using double defibrillators if pad placement isn't sufficient, and if that were to fail to use adjunctive dofetilide.  Notably potassium was normal one month ago but 8 months ago was 3.3  This will be rechecked prior to cardioversion.     Sherryl Manges

## 2014-12-15 NOTE — Transfer of Care (Signed)
Immediate Anesthesia Transfer of Care Note  Patient: Austin Golden  Procedure(s) Performed: Procedure(s): CARDIOVERSION (N/A)  Patient Location: PACU and Endoscopy Unit  Anesthesia Type:MAC  Level of Consciousness: awake, alert , oriented and sedated  Airway & Oxygen Therapy: Patient Spontanous Breathing and Patient connected to nasal cannula oxygen  Post-op Assessment: Report given to RN, Post -op Vital signs reviewed and stable and Patient moving all extremities  Post vital signs: Reviewed and stable  Last Vitals:  Filed Vitals:   12/15/14 1024  BP: 113/65  Pulse:   Temp: 36.6 C  Resp: 14    Complications: No apparent anesthesia complications

## 2014-12-15 NOTE — Anesthesia Postprocedure Evaluation (Signed)
  Anesthesia Post-op Note  Patient: Austin Golden  Procedure(s) Performed: Procedure(s) (LRB): CARDIOVERSION (N/A)  Patient Location: PACU  Anesthesia Type: MAC  Level of Consciousness: awake and alert   Airway and Oxygen Therapy: Patient Spontanous Breathing  Post-op Pain: mild  Post-op Assessment: Post-op Vital signs reviewed, Patient's Cardiovascular Status Stable, Respiratory Function Stable, Patent Airway and No signs of Nausea or vomiting  Last Vitals:  Filed Vitals:   12/15/14 1052  BP: 104/76  Pulse:   Temp:   Resp: 15    Post-op Vital Signs: stable   Complications: No apparent anesthesia complications

## 2014-12-15 NOTE — Interval H&P Note (Signed)
History and Physical Interval Note:  12/15/2014 10:07 AM  Myrtis SerBradley Bettes  has presented today for surgery, with the diagnosis of AFIB  The various methods of treatment have been discussed with the patient and family. After consideration of risks, benefits and other options for treatment, the patient has consented to  Procedure(s): CARDIOVERSION (N/A) as a surgical intervention .  The patient's history has been reviewed, patient examined, no change in status, stable for surgery.  I have reviewed the patient's chart and labs.  Questions were answered to the patient's satisfaction.     Sherryl MangesSteven Klein

## 2014-12-15 NOTE — Anesthesia Preprocedure Evaluation (Addendum)
Anesthesia Evaluation  Patient identified by MRN, date of birth, ID band Patient awake    Reviewed: Allergy & Precautions, NPO status , Patient's Chart, lab work & pertinent test results  Airway Mallampati: II  TM Distance: >3 FB Neck ROM: Full    Dental no notable dental hx.    Pulmonary neg pulmonary ROS,    Pulmonary exam normal breath sounds clear to auscultation       Cardiovascular Normal cardiovascular exam+ dysrhythmias Atrial Fibrillation  Rhythm:Regular Rate:Normal     Neuro/Psych negative neurological ROS  negative psych ROS   GI/Hepatic negative GI ROS, (+)     substance abuse  cocaine use,   Endo/Other  negative endocrine ROS  Renal/GU negative Renal ROS  negative genitourinary   Musculoskeletal negative musculoskeletal ROS (+)   Abdominal   Peds negative pediatric ROS (+)  Hematology negative hematology ROS (+)   Anesthesia Other Findings   Reproductive/Obstetrics negative OB ROS                             Anesthesia Physical Anesthesia Plan  ASA: III  Anesthesia Plan: MAC   Post-op Pain Management:    Induction: Intravenous  Airway Management Planned: Mask  Additional Equipment:   Intra-op Plan:   Post-operative Plan:   Informed Consent: I have reviewed the patients History and Physical, chart, labs and discussed the procedure including the risks, benefits and alternatives for the proposed anesthesia with the patient or authorized representative who has indicated his/her understanding and acceptance.   Dental advisory given  Plan Discussed with: CRNA and Surgeon  Anesthesia Plan Comments:        Anesthesia Quick Evaluation

## 2014-12-15 NOTE — Discharge Instructions (Signed)
You have an appointment set up with the Atrial Fibrillation Clinic.  Multiple studies have shown that being followed by a dedicated atrial fibrillation clinic in addition to the standard care you receive from your other physicians improves health. We believe that enrollment in the atrial fibrillation clinic will allow us to better care for you.  ° °The phone number to the Atrial Fibrillation Clinic is [phone]. The clinic is staffed Monday through Friday from 8:30am to 5pm. ° °Parking Directions: The clinic is located in the Heart and Vascular Building connected to [facility]. °1)From [address] turn on to [address] and go to the 3rd entrance  (Heart and Vascular entrance) on the right. °2)Look to the right for Heart &Vascular Parking Garage. °3)A code for the entrance is required please call the clinic to receive this.   °4)Take the elevators to the 1st floor. Registration is in the room with the glass walls at the end of the hallway. ° °If you have any trouble parking or locating the clinic, please don’t hesitate to call [phone]. ° ° ° °Electrical Cardioversion, Care After °Refer to this sheet in the next few weeks. These instructions provide you with information on caring for yourself after your procedure. Your health care provider may also give you more specific instructions. Your treatment has been planned according to current medical practices, but problems sometimes occur. Call your health care provider if you have any problems or questions after your procedure. °WHAT TO EXPECT AFTER THE PROCEDURE °After your procedure, it is typical to have the following sensations: °· Some redness on the skin where the shocks were delivered. If this is tender, a sunburn lotion or hydrocortisone cream may help. °· Possible return of an abnormal heart rhythm within hours or days after the procedure. °HOME CARE INSTRUCTIONS °· Take medicines only as directed by your health care provider. Be sure you  understand how and when to take your medicine. °· Learn how to feel your pulse and check it often. °· Limit your activity for 48 hours after the procedure or as directed by your health care provider. °· Avoid or minimize caffeine and other stimulants as directed by your health care provider. °SEEK MEDICAL CARE IF: °· You feel like your heart is beating too fast or your pulse is not regular. °· You have any questions about your medicines. °· You have bleeding that will not stop. °SEEK IMMEDIATE MEDICAL CARE IF: °· You are dizzy or feel faint. °· It is hard to breathe or you feel short of breath. °· There is a change in discomfort in your chest. °· Your speech is slurred or you have trouble moving an arm or leg on one side of your body. °· You get a serious muscle cramp that does not go away. °· Your fingers or toes turn cold or blue. °  °This information is not intended to replace advice given to you by your health care provider. Make sure you discuss any questions you have with your health care provider. °  °Document Released: 12/10/2012 Document Revised: 03/12/2014 Document Reviewed: 12/10/2012 °Elsevier Interactive Patient Education ©2016 [employer] ° °

## 2014-12-16 ENCOUNTER — Encounter (HOSPITAL_COMMUNITY): Payer: Self-pay | Admitting: Internal Medicine

## 2014-12-31 ENCOUNTER — Encounter (HOSPITAL_BASED_OUTPATIENT_CLINIC_OR_DEPARTMENT_OTHER): Payer: Self-pay

## 2015-01-12 ENCOUNTER — Ambulatory Visit (HOSPITAL_COMMUNITY)
Admit: 2015-01-12 | Discharge: 2015-01-12 | Disposition: A | Discharge status: Home / Self Care | Payer: BLUE CROSS/BLUE SHIELD | Source: Ambulatory Visit | Attending: Nurse Practitioner | Admitting: Nurse Practitioner

## 2015-01-12 VITALS — BP 116/74 | HR 56 | Ht 75.0 in | Wt 225.8 lb

## 2015-01-12 DIAGNOSIS — I4891 Unspecified atrial fibrillation: Secondary | ICD-10-CM | POA: Insufficient documentation

## 2015-01-12 DIAGNOSIS — I481 Persistent atrial fibrillation: Secondary | ICD-10-CM | POA: Diagnosis not present

## 2015-01-12 DIAGNOSIS — I4819 Other persistent atrial fibrillation: Secondary | ICD-10-CM

## 2015-01-12 NOTE — Progress Notes (Signed)
Patient ID: Austin Golden, male   DOB: Dec 02, 1970, 44 y.o.   MRN: [id number] Patient ID: Austin Golden, male   DOB: Dec 02, 1970, 44 y.o.   MRN: [id number]       Primary Care Physician: No PCP Per Patient Referring Physician: ER f/u   Austin Golden is a 44 y.o. male healthy without any significant health problems referred to the afib clinic from the ER for new onset afib. Pt is involved in a healthy status long term [facility] clinical [facility] and when seen there on Monday, he was told that he had afib and to f/u with cardiology. He was asymptomatic. Then he had an episode Wednesday pm when he was aware of rapid heartbeat, chest pressure and fatigue. He went to bed and when he woke up Thursday, felt better but was aware of irregular heart beat so came to the ER to be evaluated. He was rate controlled in the ER and chadsvasc was 0. Labs were unremarkable. He was d/c form the ER to f/u in the Afib clinic today.  EKG shows afib at 104 bpm,  with early repolarization. He reports snoring history with daytime somnolence, Never had a sleep study in the past. No tobacco use, nor alcohol. Was drinking alot of caffeine but recently has cut it out. Does report being a marathon runner when younger but now does not have an exercise program and has gained 30 lbs since his running days. He was addicted to cocaine in his mid 4830's but no current drug use. 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   He returns to the afib clinic, 9/7, after starting on DOAC to prepare for cardioversion. Chadsvasc score of 0.   Will be set up for DCCV when he has reached using drug x 3 weeks. He has not missed doses. Not aware of afib. It is rate controlled  with cardizem.He is pending a sleep study in October for snoring with daytime somnolence. DCCV procedure discussed with pt, risks vrs benefit and he wanted to proceed.  He returns 11/9 to the afib clinic after second DCCV on 10/12 which was  successful. He followed up with Dr. Graciela HusbandsKlein after first DCCV, 9/14, which was unsuccessful, which was very surprising in a young person with normal structure heart. Dr. Graciela HusbandsKlein personally performed the cardioversion and it was successful after the first shock. He reports that he has not noticed any further afib. He still has a chadsvasc score of 0 and will finish DOAC, which will take him past the 30 days after cardioversion and then by guidelines can stop. He will be continued on Cardizem for now but can be discussed with Dr. Graciela HusbandsKlein if this can be stopped on f/u with him.  Today, he denies symptoms of  chest pain, shortness of breath, orthopnea, PND, lower extremity edema, dizziness, presyncope, syncope, or neurologic sequela. Aware of papitations.The patient is tolerating medications without difficulties and is otherwise without complaint today.   Past Medical History  Diagnosis Date  . Cocaine addiction (HCC)   . Substance abuse   . A-fib (HCC) 11/18/2014   Past Surgical History  Procedure Laterality Date  . Cardioversion N/A 11/18/2014    Procedure: CARDIOVERSION;  Surgeon: Jake BatheMark C Skains, MD;  [facility];  Service: Cardiovascular;  Laterality: N/A;  . Cardioversion N/A 12/15/2014    Procedure: CARDIOVERSION;  Surgeon: [facility] SalviaSteven C Klein, MD;  [facility];  Service: Cardiovascular;  Laterality: N/A;    Current Outpatient  Prescriptions  Medication Sig Dispense Refill  . diltiazem (CARDIZEM CD) 120 MG 24 hr capsule Take 1 capsule (120 mg total) by mouth daily. 30 capsule 6  . rivaroxaban (XARELTO) 20 MG TABS tablet Take 1 tablet (20 mg total) by mouth daily with supper. 30 tablet 6   No current facility-administered medications for this encounter.    No Known Allergies  Social History   Social History  . Marital Status: Divorced    Spouse Name: N/A  . Number of Children: N/A  . Years of Education: N/A   Occupational History  . Not on file.   Social History Main  Topics  . Smoking status: Never Smoker   . Smokeless tobacco: Not on file  . Alcohol Use: No  . Drug Use: No  . Sexual Activity: No   Other Topics Concern  . Not on file   Social History Narrative    No family history on file.  ROS- All systems are reviewed and negative except as per the HPI above  Physical Exam: Filed Vitals:   01/12/15 1011  BP: 116/74  Pulse: 56  Height:  (1.905 m)  Weight: 225 lb 12.8 oz (102.422 kg)    GEN- The patient is well appearing, alert and oriented x 3 today.   Head- normocephalic, atraumatic Eyes-  Sclera clear, conjunctiva pink Ears- hearing intact Oropharynx- clear Neck- supple, no JVP Lymph- no cervical lymphadenopathy Lungs- Clear to ausculation bilaterally, normal work of breathing Heart-regular rate and rhythm, no murmurs, rubs or gallops, PMI not laterally displaced GI- soft, NT, ND, + BS Extremities- no clubbing, cyanosis, or edema MS- no significant deformity or atrophy Skin- no rash or lesion Psych- euthymic mood, full affect Neuro- strength and sensation are intact  EKG- SB at 56, pr int 160 ms, qrs int 80 ms, qtc 368 ms. Epic records reviewed  Echo-Left ventricle: The cavity size was normal. Wall thickness was normal. Systolic function was normal. The estimated ejection fraction was in the range of 50% to 55%. Wall motion was normal; there were no regional wall motion abnormalities. Global longitudinal strain: - 15.7% - Mitral valve: Calcified annulus. Mildly thickened leaflets . There was mild regurgitation.   Assessment and Plan:  1. New onset afib DCCV x 2 first attempt unsuccessful Maintaining SR Continue cardizem until f/u with Dr. Graciela Husbands and then can can discuss if can be stopped  2. Chadsvasc score of 0 Finish DOAC on hand which will take him past 30 days post cardioversion After that according to guidelines, can stay off blood thinner.  F/u with Dr. Graciela Husbands in 3-4 months  Afib clinic as  needed  Elvina Sidle. [facility] [address] [place], [place] [address] [phone]

## 2015-06-23 ENCOUNTER — Encounter: Payer: Self-pay | Admitting: Internal Medicine

## 2017-08-11 IMAGING — DX DG CHEST 2V
2 series · 2 of 2 positions shown · non-contrast
Comparison: None.

CLINICAL DATA: New atrial fibrillation.

EXAM:
CHEST  2 VIEW

[w chest pa]
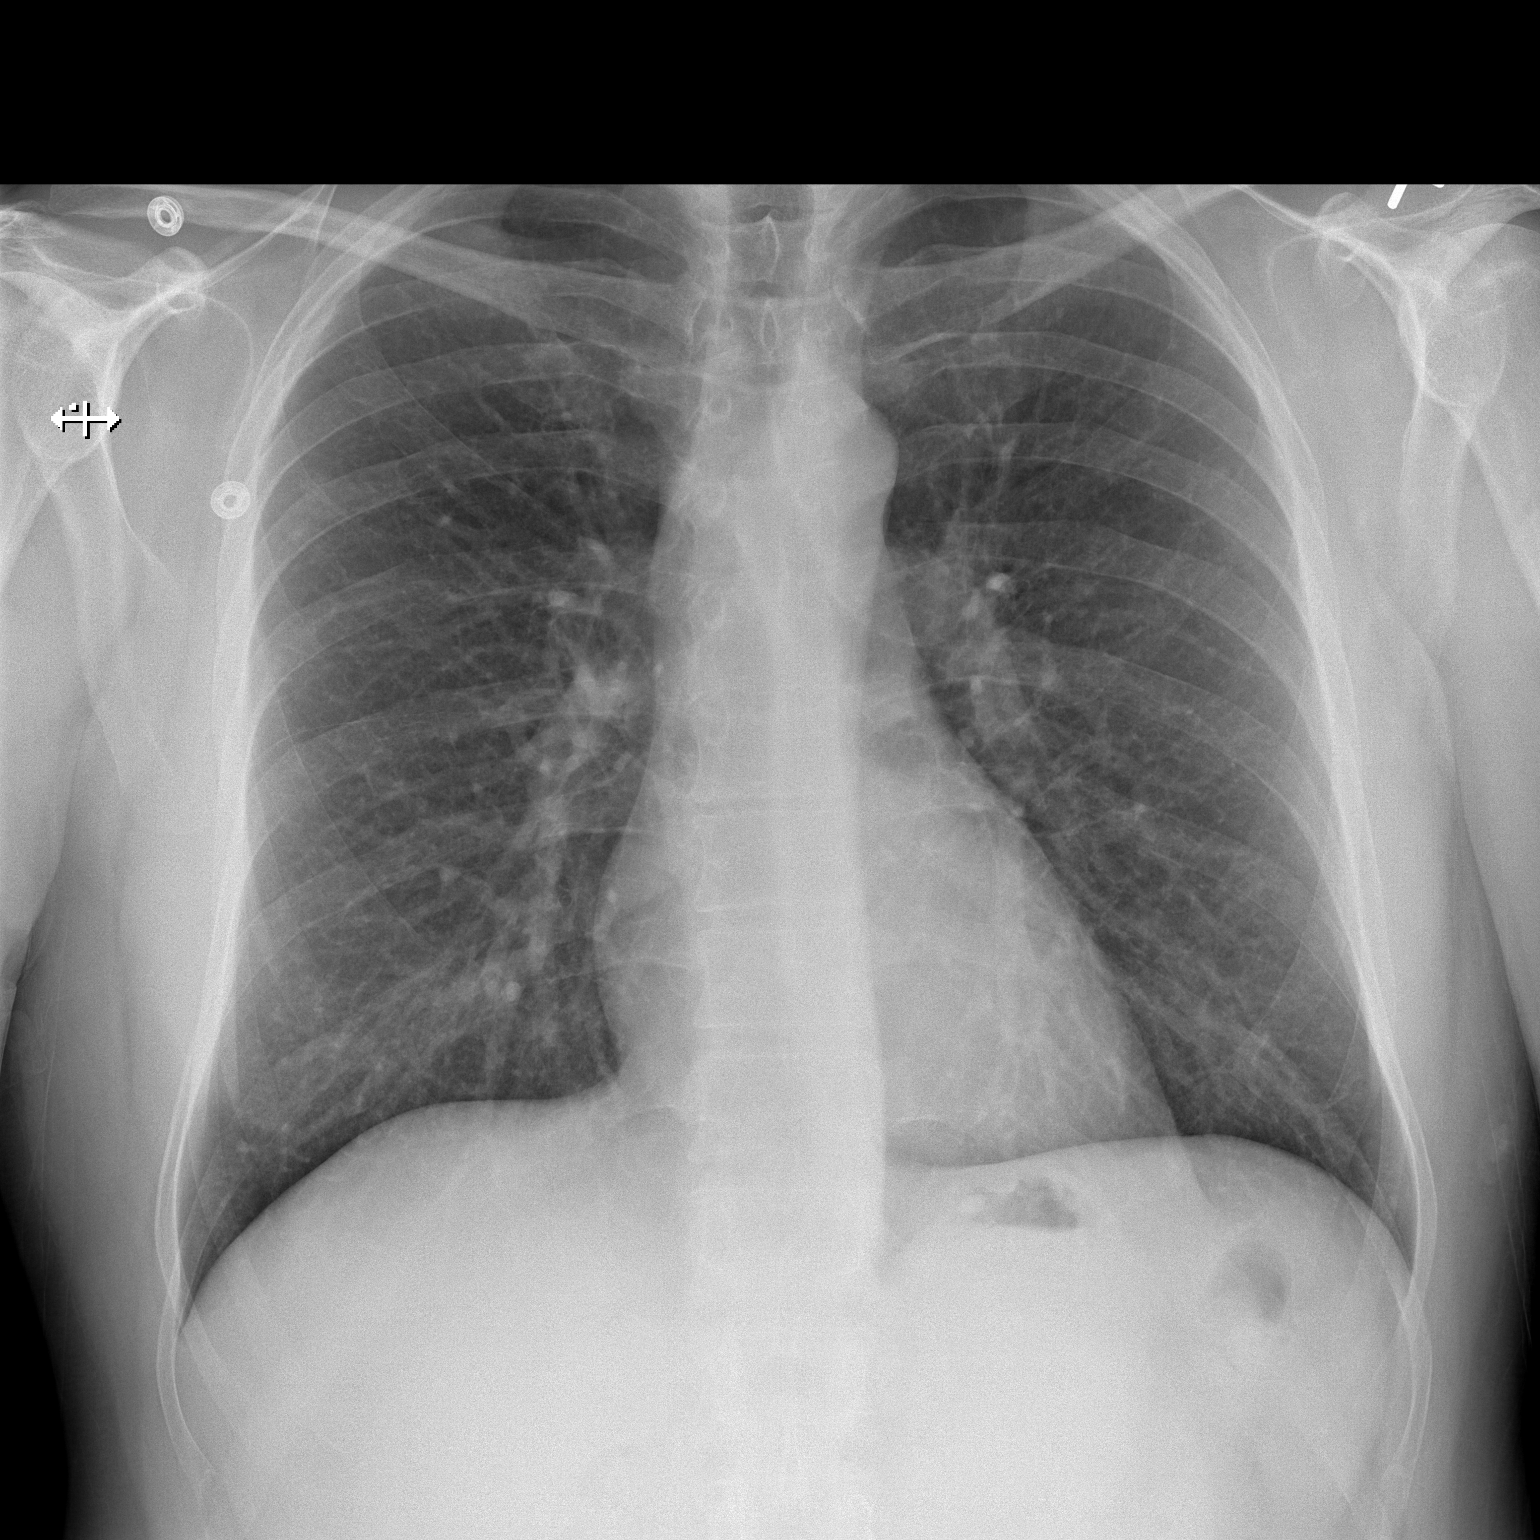

[w chest lat]
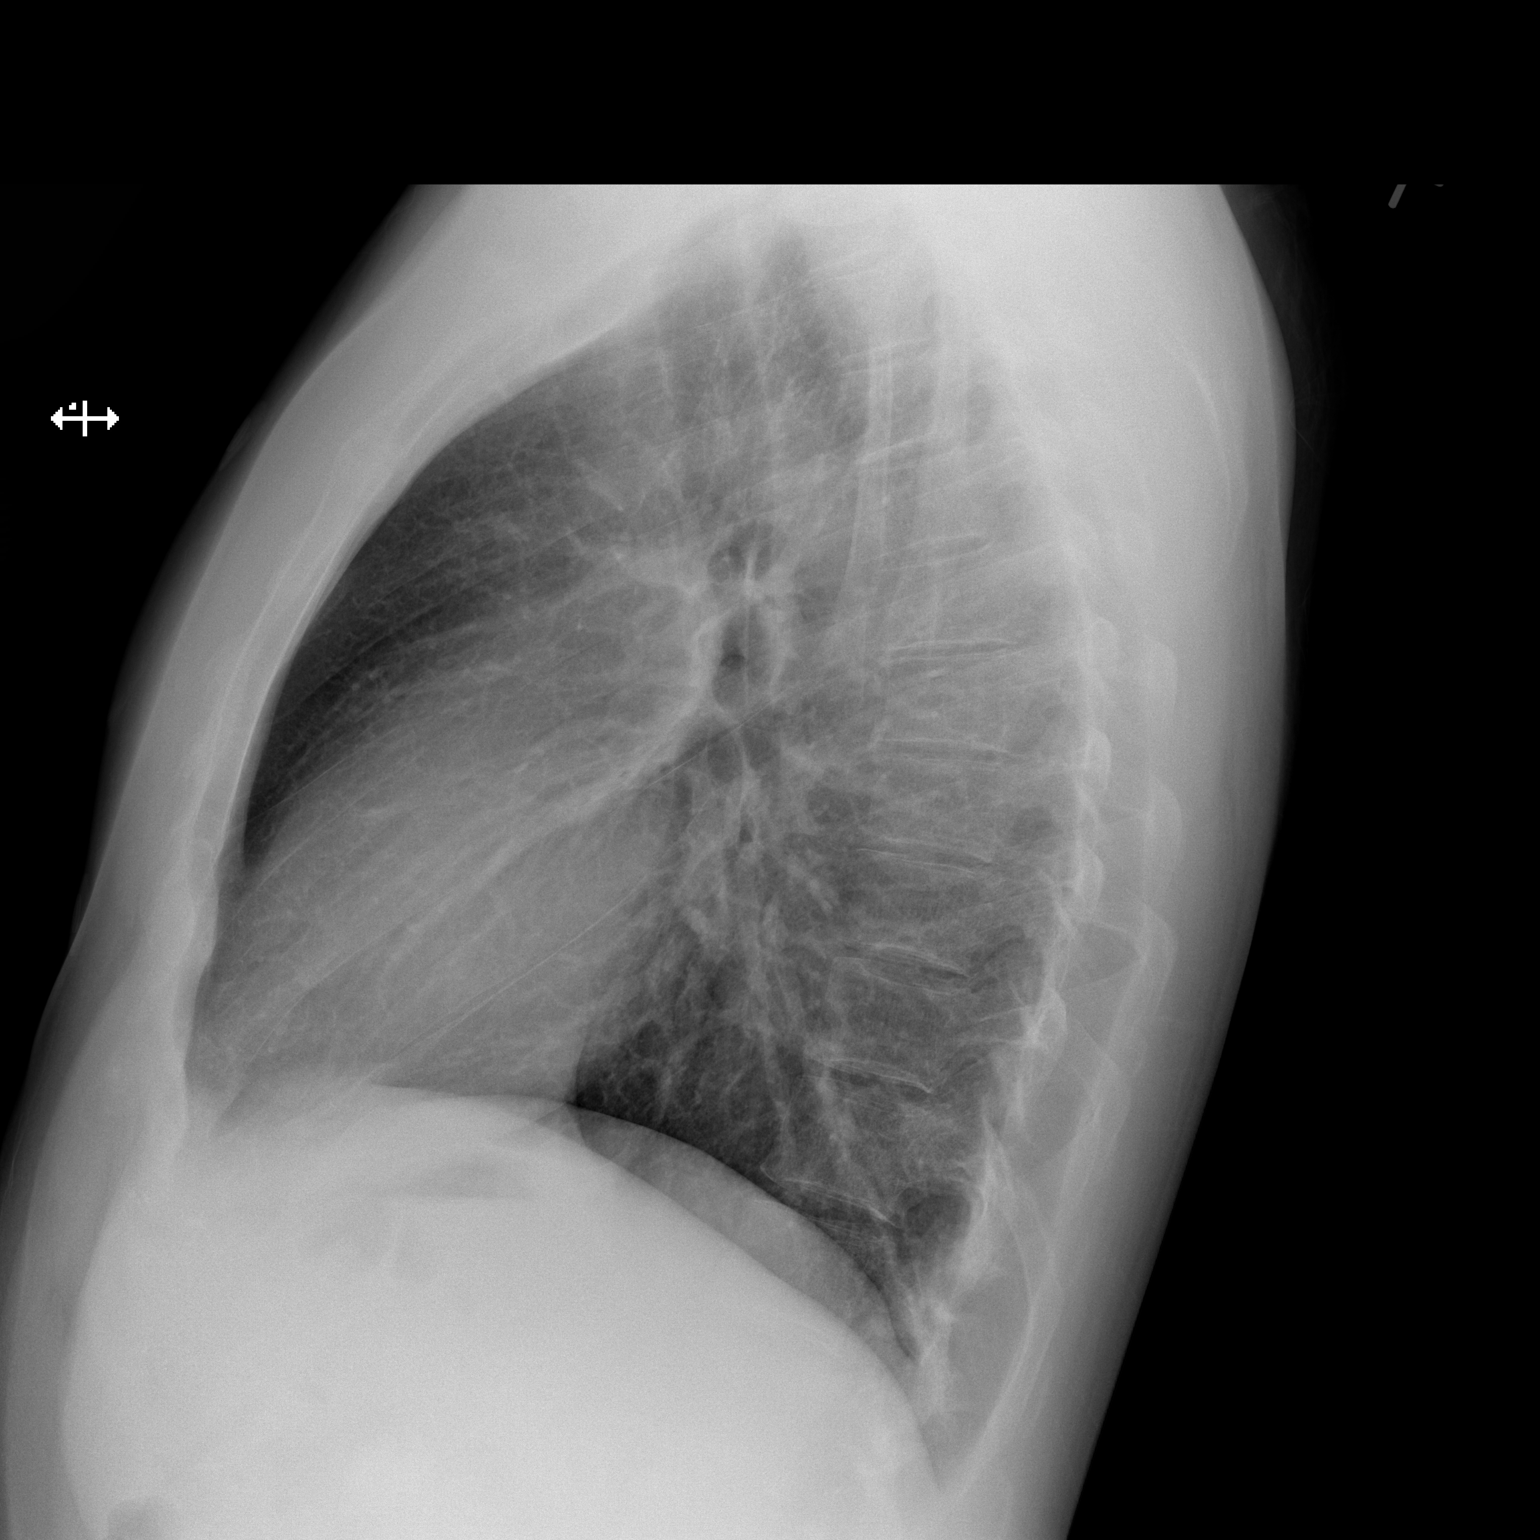

[2 of 2 positions shown; findings below may reference images not displayed]

FINDINGS: The cardiomediastinal silhouette is within normal limits. The lungs
are well inflated and clear. There is no evidence of pleural
effusion or pneumothorax. No acute osseous abnormality is
identified.
IMPRESSION: No active cardiopulmonary disease.
